# Patient Record
Sex: Female | Born: 1970
Health system: Southern US, Community
[De-identification: ages and names within clinical notes are randomized; demographics above are authoritative.]

## PROBLEM LIST (undated history)

## (undated) DIAGNOSIS — E559 Vitamin D deficiency, unspecified: Secondary | ICD-10-CM

## (undated) DIAGNOSIS — E538 Deficiency of other specified B group vitamins: Secondary | ICD-10-CM

## (undated) DIAGNOSIS — I10 Essential (primary) hypertension: Secondary | ICD-10-CM

## (undated) DIAGNOSIS — E785 Hyperlipidemia, unspecified: Secondary | ICD-10-CM

## (undated) DIAGNOSIS — K829 Disease of gallbladder, unspecified: Secondary | ICD-10-CM

## (undated) HISTORY — DX: Vitamin D deficiency, unspecified: E55.9

## (undated) HISTORY — PX: CHOLECYSTECTOMY: SHX55

## (undated) HISTORY — DX: Deficiency of other specified B group vitamins: E53.8

## (undated) HISTORY — DX: Disease of gallbladder, unspecified: K82.9

## (undated) HISTORY — DX: Hyperlipidemia, unspecified: E78.5

## (undated) HISTORY — DX: Essential (primary) hypertension: I10

## (undated) HISTORY — PX: WISDOM TOOTH EXTRACTION: SHX21

---

## 1999-04-05 ENCOUNTER — Encounter (INDEPENDENT_AMBULATORY_CARE_PROVIDER_SITE_OTHER): Payer: Self-pay | Admitting: Specialist

## 1999-04-05 ENCOUNTER — Encounter: Payer: Self-pay | Admitting: General Surgery

## 1999-04-05 ENCOUNTER — Observation Stay (HOSPITAL_COMMUNITY): Admission: RE | Admit: 1999-04-05 | Discharge: 1999-04-06 | Payer: Self-pay | Admitting: General Surgery

## 2000-02-21 ENCOUNTER — Other Ambulatory Visit: Admission: RE | Admit: 2000-02-21 | Discharge: 2000-02-21 | Payer: Self-pay | Admitting: Obstetrics and Gynecology

## 2001-10-29 ENCOUNTER — Other Ambulatory Visit: Admission: RE | Admit: 2001-10-29 | Discharge: 2001-10-29 | Payer: Self-pay | Admitting: Obstetrics and Gynecology

## 2002-10-28 ENCOUNTER — Encounter (INDEPENDENT_AMBULATORY_CARE_PROVIDER_SITE_OTHER): Payer: Self-pay

## 2002-10-28 ENCOUNTER — Inpatient Hospital Stay (HOSPITAL_COMMUNITY): Admission: AD | Admit: 2002-10-28 | Discharge: 2002-10-31 | Payer: Self-pay | Admitting: Obstetrics and Gynecology

## 2002-11-26 ENCOUNTER — Other Ambulatory Visit: Admission: RE | Admit: 2002-11-26 | Discharge: 2002-11-26 | Payer: Self-pay | Admitting: Obstetrics and Gynecology

## 2005-11-12 ENCOUNTER — Ambulatory Visit: Payer: Self-pay | Admitting: Family Medicine

## 2006-05-14 ENCOUNTER — Ambulatory Visit: Payer: Self-pay | Admitting: Family Medicine

## 2006-07-31 ENCOUNTER — Ambulatory Visit: Payer: Self-pay | Admitting: Family Medicine

## 2008-03-17 ENCOUNTER — Ambulatory Visit: Payer: Self-pay | Admitting: Family Medicine

## 2010-02-21 ENCOUNTER — Telehealth: Payer: Self-pay | Admitting: Family Medicine

## 2010-04-22 ENCOUNTER — Encounter: Payer: Self-pay | Admitting: Family Medicine

## 2010-04-23 ENCOUNTER — Ambulatory Visit: Payer: Self-pay | Admitting: Family Medicine

## 2010-04-30 ENCOUNTER — Ambulatory Visit: Payer: Self-pay | Admitting: Family Medicine

## 2010-05-03 ENCOUNTER — Encounter: Payer: Self-pay | Admitting: Family Medicine

## 2010-08-26 LAB — CONVERTED CEMR LAB: Pap Smear: NORMAL

## 2010-08-30 NOTE — Letter (Signed)
Summary: Results Follow up Letter  [facility]  [address] [place], [place] [address]   Phone: [phone]  Fax: [phone]    05/03/2010 MRN: [id number]    [facility] [address] [place], [place]  [address]    Dear Ms. Inscoe,  The following are the results of your recent test(s):  Test         Result    Pap Smear:        Normal _X___  Not Normal _____ Comments: ______________________________________________________ Cholesterol: LDL(Bad cholesterol):         Your goal is less than:         HDL (Good cholesterol):       Your goal is more than: Comments:  ______________________________________________________ Mammogram:        Normal _____  Not Normal _____ Comments:  ___________________________________________________________________ Hemoccult:        Normal _____  Not normal _______ Comments:    _____________________________________________________________________ Other Tests:    We routinely do not discuss normal results over the telephone.  If you desire a copy of the results, or you have any questions about this information we can discuss them at your next office visit.   Sincerely,    Idamae Schuller Tower,MD  MT/ri

## 2010-08-30 NOTE — Progress Notes (Signed)
Summary: needs order for labs  Phone Note Call from Patient Call back at [phone]   Caller: Patient Call For: Dr. Milinda Antis Summary of Call: Pt is coming in for appt in october and she would like to have labs prior.  Please order. Initial call taken by: Lowella Petties CMA,  February 21, 2010 1:57 PM  Follow-up for Phone Call        please do lipids and wellness v70.0- thanks  Follow-up by: Judith Part MD,  February 21, 2010 2:10 PM

## 2010-08-30 NOTE — Assessment & Plan Note (Signed)
Summary: CPX/DLO   Vital Signs:  Patient profile:   40 year old female Height:      62.25 inches Weight:      278 pounds BMI:     50.62 Temp:     98.3 degrees F oral Pulse rate:   100 / minute Pulse rhythm:   regular BP sitting:   120 / 76  (left arm) Cuff size:   large  Vitals Entered By: Lewanda Rife LPN (April 30, 2010 9:47 AM) CC: CPX GYN  does pap smear but pt would like breast exam today.   History of Present Illness: here for health mt exam and to review chronic med problems   sees gyn for pap wants breast exam today  labs from [facility] all normal wellness prof good chol with trig 131 and HDL 50 and LDL 102 this is better than last chol report   is trying to do better with her diet   is trying to exercise- zumba and walking  wt is stable   bp is good   leep in the past  due for pap - last summer 2010  no lumps in breasts  no mammograms   gets her flu shots at work         Allergies (verified): No Known Drug Allergies  Past History:  Family History: Last updated: 04/30/2010 adopted - does not know family history  Social History: Last updated: 04/30/2010 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 Eyes:  Denies blurring and eye irritation. CV:  Denies chest pain or discomfort, lightheadness, and palpitations. Resp:  Denies cough, pleuritic, and wheezing. GI:  Denies abdominal pain, change in bowel habits, and indigestion. GU:  Denies abnormal vaginal  bleeding, discharge, dysuria, and urinary frequency. MS:  Denies joint pain, joint redness, and joint swelling. Derm:  Denies itching, lesion(s), poor wound healing, and rash. Neuro:  Denies numbness and tingling. Psych:  Denies anxiety and depression. Endo:  Denies cold intolerance, excessive thirst, excessive urination, and heat intolerance. Heme:  Denies abnormal bruising and bleeding.  Physical Exam  General:  morbidly obese and well appearing  Head:  normocephalic, atraumatic, and no abnormalities observed.   Eyes:  vision grossly intact, pupils equal, pupils round, and pupils reactive to light.  no conjunctival pallor, injection or icterus  Ears:  R ear normal and L ear normal.   Nose:  no nasal discharge.   Mouth:  pharynx pink and moist.   Neck:  supple with full rom and no masses or thyromegally, no JVD or carotid bruit  Breasts:  No mass, nodules, thickening, tenderness, bulging, retraction, inflamation, nipple discharge or skin changes noted.   Lungs:  Normal respiratory effort, chest expands symmetrically. Lungs are clear to auscultation, no crackles or wheezes. Heart:  Normal rate and regular rhythm. S1 and S2 normal without gallop, murmur, click, rub or other extra sounds. Abdomen:  Bowel sounds positive,abdomen soft and non-tender without masses, organomegaly  or hernias noted. no renal bruits  Genitalia:  Normal introitus for age, no external lesions, no vaginal discharge, mucosa pink and moist, no vaginal or cervical lesions, no vaginal atrophy, no friaility or hemorrhage, normal uterus size and position, no adnexal masses or tenderness cervix is very posterior and obesity limits exam somewhat  Msk:  No deformity or scoliosis noted of thoracic or lumbar spine.  no acute joint changes  Pulses:  R and L carotid,radial,femoral,dorsalis pedis and posterior tibial pulses are full and equal bilaterally Extremities:  No clubbing, cyanosis, edema, or deformity noted with normal full  range of motion of all joints.   Neurologic:  sensation intact to light touch, gait normal, and DTRs symmetrical and normal.   Skin:  Intact without suspicious lesions or rashes lentigos diffusely  Cervical Nodes:  No lymphadenopathy noted Axillary Nodes:  No palpable lymphadenopathy Inguinal Nodes:  No significant adenopathy Psych:  normal affect, talkative and pleasant    Impression & Recommendations:  Problem # 1:  HEALTH MAINTENANCE EXAM (ICD-V70.0) Assessment Comment Only reviewed health habits including diet, exercise and skin cancer prevention reviewed health maintenance list and family history labs reviewed with pt today-- cholesterol is good! disc imp of wt loss to general health   Problem # 2:  ROUTINE GYNECOLOGICAL EXAMINATION (ICD-V72.31) Assessment: Comment Only annual exam with pap  remote hx of leep  iud year 7 no problems (non hormone type)   Complete Medication List: 1)  Motrin Ib 200 Mg Tabs (Ibuprofen) .... Otc as directed.  Patient Instructions: 1)  cholesterol is good  2)  keep working on healthy diet and exercise  3)  think about trying weight watchers again  4)  keep up the great exercise 5)  get your flu shot at work   Current Allergies (reviewed today): No known allergies      Preventive Care Screening  Pap Smear:    Date:  01/26/2009    Results:  normal

## 2010-12-14 NOTE — Op Note (Signed)
NAME:  Ana Johnson, Ana Johnson                        ACCOUNT NO.:  [id number]   MEDICAL RECORD NO.:  [id number]                   PATIENT TYPE:  INP   LOCATION:  9131                                 FACILITY:  [facility]   PHYSICIAN:  Randye Lobo, M.D.                DATE OF BIRTH:  April 04, 1971   DATE OF PROCEDURE:  10/28/2002  DATE OF DISCHARGE:                                 OPERATIVE REPORT   PREOPERATIVE DIAGNOSES:  1. Intrauterine gestation at 59 + 2 weeks'.  2. Cephalopelvic disproportion.  3. Chorioamnionitis.  4. Positive group B strep status.   POSTOPERATIVE DIAGNOSES:  1. Intrauterine gestation at 4 + 2 weeks'.  2. Cephalopelvic disproportion.  3. Chorioamnionitis.  4. Group B strep, positive status.  5. Moderate meconium-stained amniotic fluid.   PROCEDURE:  Primary low segment transverse cesarean section.   SURGEON:  Randye Lobo, M.D.   ASSISTANT:  Miguel Aschoff, M.D.   ANESTHESIA:  Epidural.   IV FLUIDS:  500 mL Ringers' lactate.   ESTIMATED BLOOD LOSS:  800 mL.   URINE OUTPUT:  50 mL.   COMPLICATIONS:  None.   INDICATIONS FOR PROCEDURE:  The patient is a 40 year old gravida 1, para 0,  who presented at 34 + 2 weeks' gestation with contractions over the last  three days.  The patient had reported leakage of fluid per vagina since  10/25/2002.  The patient was noted to have a positive group B Streptococcus  status.  The patient was 1-2 cm dilated and 100% effaced with the vertex at  the -1 station at the time of admission.  The fetal heart rate tracing was  reactive with a baseline fetal heart rate of approximately 160 beats.  Penicillin was begun for the group B Streptococcus-positive status.  The  patient received Stadol intravenous for pain control.  The patient went on  to have augmentation of her labor with Pitocin.  The patient did receive an  epidural for anesthesia.  The patient did develop hypertension following the  epidural which was treated with  ephedrine for normalization of her blood  pressure.  NIAPC was placed to assist in the administration of Pitocin.  The  patient was able to achieve adequate contractions.  Intermittently, the  fetal heart rate tracing would develop late decelerations which would  spontaneously resolve and revert to a reactive fetal heart rate tracing.  A  significant amount of caput was appreciated as the fetal vertex when the  cervix was noted to be 6 cm dilated and the vertex presentation was  confirmed at this time.  The patient went on to achieve 9 cm of cervical  dilation.  There was a significant bony prominence which was located on the  right posterior pelvic side wall.  The pubic arch was noted to be tight and  the vertex was noted to be at the -2 station with a significant amount of  caput.  The patient was diagnosed with cephalopelvic disproportion at this  point and the contracted pelvis and a recommendation was made to proceed  with a primary low segment transverse cesarean section after the risks,  benefits, and alternatives were reviewed with her and her husband.  They  chose to proceed.   FINDINGS:  A viable female infant was delivered at 23.  The Apgars were 7  at one minute and 9 at five minutes.  Presentation was noted to be left  occiput posterior.  A large amount of fetal caput and molding were  appreciated.  The weight of the newborn was later noted to be 8 pounds and 9  ounces.  Moderate meconium was appreciated at the time of cesarean section  and DeLee suctioning of the nares and mouth were performed of the newborn.   Examination of the pelvis and pelvic organs documented a very prominent  sacral promontory and a contracted pelvis.  The fallopian tubes and ovaries  were noted to be normal.  There was a 1.5 cm subserosal posterior fundal  fibroid.  The cord pH was noted to be 7.30.   SPECIMENS:  The placenta was sent to Pathology.   PROCEDURE:  With an IV and epidural and Foley  catheter in place, the patient  was taken from her labor and delivery suite to the operating room.  Her  epidural was dosed and the patient was placed in the supine position on the  operating room table.  The abdomen was sterilely prepped and draped.  Surgical anesthesia was achieved and a Pfannenstiel incision was then  created sharply with a scalpel and carried down to the fascia using  monopolar cautery.  The fascia was then incised in the midline with a  scalpel and the incision was carried out bilaterally using the Mayo  scissors.  The rectus muscles were dissected off of the overlying fascia  both superiorly and inferiorly and the rectus muscles were then divided  sharply in the midline.  A hemostat was used to elevated the parietal  peritoneum which was entered sharply with the Metzenbaum scissors cranially  and caudally.   The lower uterine segment was exposed and a bladder flap was sharply  created.  A transverse lower uterine segment incision was then created  sharply with the scalpel and the incision was extended with the bandage  scissors and then bluntly.  A hand was inserted through the incision and the  vertex was elevated and delivered without difficulty.  The nares and mouth  were suctioned with the DeLee suction and the shoulders and the remainder of  the infant were next delivered.  The cord was doubly clamped and cut and the  newborn was carried over to the awaiting pediatricians in vigorous  condition.   A cord pH and cord blood were obtained.  The placenta was then manually  extracted and remaining membranes were removed from within the uterine  cavity.  A moistened lap pad was used to wipe the inside of the uterine  cavity as well.  The uterine incision was then closed with a single layer  closure of locked 1 chromic.  There was a small extension which was noted in the left inferior aspect of the incision which was closed with a running-  locked suture of 1  chromic as well.  This was out of the way of the uterine  arteries and the ureter on this side.   The peritoneal cavity was then suctioned of  any remaining blood and clots.  An examination of the bony pelvis was performed and the findings as noted  above.  The uterus was then returned to the peritoneal cavity as it had been  exteriorized for the closure.  The parietal peritoneum was next closed with  a running suture of 3-0 plain.  The rectus muscles were approximated with  two figure-of-eight suture of  1 chromic.  The fascia was closed with a  running suture of 0 Vicryl.  The subcutaneous tissue was irrigated and  suctioned and made hemostatic with monopolar cautery.  Interrupted sutures  of 3-0 plain were placed in the subcutaneous layer and staples were placed  to close the skin.  A sterile bandage was placed over the incision.   This concluded the patient's procedure.  There were no complications to the  procedure.  All sponge, instrument, and needle counts were correct.   Based on the patient's contracted, bony pelvis, a recommendation is made to  proceed with repeat cesarean section for future deliveries.                                               Randye Lobo, M.D.    BES/MEDQ  D:  10/28/2002  T:  10/29/2002  Job:  [id number]

## 2010-12-14 NOTE — Discharge Summary (Signed)
NAME:  Ana Johnson, Ana Johnson                        ACCOUNT NO.:  [id number]   MEDICAL RECORD NO.:  [id number]                   PATIENT TYPE:  INP   LOCATION:  9131                                 FACILITY:  [facility]   PHYSICIAN:  Michelle L. Vincente Poli, M.D.            DATE OF BIRTH:  1971-06-05   DATE OF ADMISSION:  10/28/2002  DATE OF DISCHARGE:  10/31/2002                                 DISCHARGE SUMMARY   FINAL DIAGNOSES:  1. Intrauterine pregnancy at 61 and two-sevenths weeks gestation.  2. Cephalopelvic disproportion.  3. Positive group B strep status.  4. Chorioamnionitis and postpartum endometritis.   PROCEDURE:  Primary low segment transverse cesarean section.   SURGEON:  Randye Lobo, M.D.   ASSISTANT:  Miguel Aschoff, M.D.   COMPLICATIONS:  None.   HISTORY OF PRESENT ILLNESS:  This 40 year old G1 P0 presented at 26 and two-  sevenths weeks gestation with contractions over the last three days.  She  did report leakage of fluid per the vagina for the last day or two but was  uncertain if it was urine or amniotic fluid.  Upon admission the patient's  cervix was about 1-2 cm dilated, 100% effaced.  The patient's antepartum  course had been complicated by a nonimmune rubella status.  The patient was  also adopted and we do not have a family history on her.  She did have a  positive group B strep culture and history of a LEEP.  Otherwise, her  antepartum course had been uncomplicated.   HOSPITAL COURSE:  At this point she was admitted with questionable prolonged  latent phase of labor.  She was started on penicillin for her positive group  B strep status.  The patient did receive Stadol IV for pain control.  She  went on to have augmentation of her labor with Pitocin and did receive an  epidural for anesthesia.  She did develop some hypotension with the epidural  which was treated with ephedrine.  IUPCs were placed.  The patient did  achieve adequate contraction pattern.  The fetal  heart rate tracing was  reactive but did start to develop some late decelerations which did resolve  spontaneously.  The patient did go on to develop a low-grade temperature and  Tylenol was started, probably secondary to prolonged rupture of membranes.  She did get to about 9 cm dilated and -2 station at which time the pubic  arch was noted to be tight and a significant amount of caput was noted.  At  this point the patient was diagnosed with cephalopelvic disproportion and  the decision was made to proceed with a primary low segment transverse  cesarean section after the risks and benefits were discussed with the  patient and her husband.  At this point the patient was taken to the  operating room on October 28, 2002 by Dr. Conley Simmonds where a primary low  transverse cesarean  section was performed with the delivery of an 8 pound  9 ounce female infant with Apgars 7 and 9.  There was a prominent sacral  promontory noted and the patient did have moderate meconium-stained fluid.  At this point Dr. Edward Jolly did recommend repeat cesarean sections for future  deliveries secondary to her contracted bony pelvis.   The patient's postoperative course was still complicated by some low-grade  temperatures.  She was still contained on her antibiotic for her  chorioamnionitis.  She was changed from penicillin to ampicillin and  gentamycin and on postoperative day #2, clindamycin was added for anaerobic  coverage.  By postoperative day #3 the patient had been afebrile for 24  hours, she was feeling well, and she was felt ready for discharge.  She did  receive rubella vaccine before discharge.   DISPOSITION:  1. She was sent home on a regular diet.  2. Told to decrease activity.  3. Told to continue prenatal vitamins.  4. Given Percocet one to two q.4-6h. as needed for pain.  5. Follow up in the office in four weeks.   LABORATORY DATA ON DISCHARGE:  The patient had a hemoglobin of 10.9, a white  blood  cell count of 18.5.     Leilani Able, P.A.-C.                Michelle L. Vincente Poli, M.D.    MB/MEDQ  D:  11/18/2002  T:  11/18/2002  Job:  [id number]

## 2012-12-23 ENCOUNTER — Telehealth: Payer: Self-pay | Admitting: Family Medicine

## 2012-12-23 NOTE — Telephone Encounter (Signed)
Noted. Thanks.

## 2012-12-23 NOTE — Telephone Encounter (Signed)
Patient Information:  Caller Name: Marti Sleigh  Phone: [phone]  Patient: Ana Johnson, Ana Johnson  Gender: Female  DOB: 1971-01-21  Age: 42 Years  PCP: [facility], [facility])  Pregnant: No  Office Follow Up:  Does the office need to follow up with this patient?: No  Instructions For The Office: N/A   Symptoms  Reason For Call & Symptoms: Katiana states she had onset of rash to neck and chest on 12/20/12 after eating.  Red , pinpoint raised rash. Itchy. States rash has spread to stomach  and thighs. No fever. Per widespread rash protocol has see today  or tomorrow disposition due to mild widespread rash. Declined appointment and states she prefers to go to [facility]. Care advice given.  Reviewed Health History In EMR: Yes  Reviewed Medications In EMR: Yes  Reviewed Allergies In EMR: Yes  Reviewed Surgeries / Procedures: Yes  Date of Onset of Symptoms: 12/20/2012  Treatments Tried: Benadryl, Zyrtec  Treatments Tried Worked: No OB / GYN:  LMP: 12/23/2012  Guideline(s) Used:  Rash or Redness - Widespread  Disposition Per Guideline:   See Today or Tomorrow in Office  Reason For Disposition Reached:   Mild widespread rash  Advice Given:  Reassurance:  There are many causes of widespread rashes and most of the time they are not serious. Common causes include viral illness (e.g., cold viruses) and allergic reactions (to a food, medicine, or environmental exposure).  For Itchy Rashes:  Wash the skin once with gentle non-perfumed soap to remove any irritants. Rinse the soap off thoroughly.  You may also take an oatmeal (Aveeno) bath or take an antihistamine medication by mouth to help reduce the itching.  Oral Antihistamine Medication for Itching:  Take an antihistamine like diphenhydramine (Benadryl) for widespread rashes that itch. The adult dosage of Benadryl is 25-50 mg by mouth 4 times daily.  An over-the-counter antihistamine that causes less sleepiness is loratadine  (e.g., Alavert or Claritin).  Call Back If:   You become worse  Expected Course:  Most viral rashes disappear within 48 hours.  Patient Refused Recommendation:  Patient Will Go To [facility]  prefers to go to [facility]

## 2014-11-02 ENCOUNTER — Telehealth: Payer: Self-pay | Admitting: Family Medicine

## 2014-11-02 NOTE — Telephone Encounter (Signed)
Opened in error

## 2015-03-13 ENCOUNTER — Ambulatory Visit: Payer: Self-pay | Admitting: Family Medicine

## 2015-03-16 ENCOUNTER — Ambulatory Visit (INDEPENDENT_AMBULATORY_CARE_PROVIDER_SITE_OTHER): Payer: Federal, State, Local not specified - PPO | Admitting: Family Medicine

## 2015-03-16 ENCOUNTER — Encounter: Payer: Self-pay | Admitting: Family Medicine

## 2015-03-16 ENCOUNTER — Encounter (INDEPENDENT_AMBULATORY_CARE_PROVIDER_SITE_OTHER): Payer: Self-pay

## 2015-03-16 VITALS — BP 128/72 | HR 90 | Temp 98.0°F | Ht 62.0 in | Wt 284.8 lb

## 2015-03-16 DIAGNOSIS — Z0000 Encounter for general adult medical examination without abnormal findings: Secondary | ICD-10-CM

## 2015-03-16 DIAGNOSIS — Z01419 Encounter for gynecological examination (general) (routine) without abnormal findings: Secondary | ICD-10-CM

## 2015-03-16 NOTE — Assessment & Plan Note (Signed)
Reviewed preventive care protocols, scheduled due services, and updated immunizations Discussed nutrition, exercise, diet, and healthy lifestyle.  Orders Placed This Encounter  Procedures  . CBC with Differential/Platelet  . Comprehensive metabolic panel  . Lipid panel  . TSH  . Vitamin B12  . Vitamin D, 25-hydroxy

## 2015-03-16 NOTE — Patient Instructions (Signed)
Great to see you. We will call you with your lab results and you can view them online.

## 2015-03-16 NOTE — Progress Notes (Signed)
Subjective:   Patient ID: Ana Johnson, female    DOB: 1970/08/24, 44 y.o.   MRN: [id number]  Ana Johnson is a pleasant 44 y.o. year old female who presents to clinic today with [place] and Annual Exam  on 03/16/2015  HPI:  G1P1 here to establish care and for CPX.  Has GYN- Dr. Harrington Challenger- had pap smear and mammogram 08/2014.  Doing well- has a 17 year old daughter, married and elderly parents live with her.  No current outpatient prescriptions on file prior to visit.   No current facility-administered medications on file prior to visit.    No Known Allergies  History reviewed. No pertinent past medical history.  Past Surgical History  Procedure Laterality Date  . Cholecystectomy    . Cesarean section    . Wisdom tooth extraction      Family History  Problem Relation Age of Onset  . Adopted: Yes    Social History   Social History  . Marital Status: Married    Spouse Name: N/A  . Number of Children: N/A  . Years of Education: N/A   Occupational History  . Not on file.   Social History Main Topics  . Smoking status: Never Smoker   . Smokeless tobacco: Never Used  . Alcohol Use: No  . Drug Use: No  . Sexual Activity: Yes   Other Topics Concern  . Not on file   Social History Narrative   The PMH, PSH, Social History, Family History, Medications, and allergies have been reviewed in [facility], and have been updated if relevant.  Review of Systems  Constitutional: Positive for fatigue. Negative for fever and unexpected weight change.  HENT: Negative.   Eyes: Negative.   Respiratory: Negative.   Cardiovascular: Negative.   Gastrointestinal: Negative.   Endocrine: Negative.   Genitourinary: Negative.   Musculoskeletal: Negative.   Skin: Negative.   Allergic/Immunologic: Negative.   Neurological: Negative.   Hematological: Negative.   Psychiatric/Behavioral: Negative.   All other systems reviewed and are negative.      Objective:    BP 128/72  mmHg  Pulse 90  Temp(Src) 98 F (36.7 C) (Oral)  Ht 5' 2"  (1.575 m)  Wt 284 lb 12 oz (129.162 kg)  BMI 52.07 kg/m2  SpO2 98%  LMP 03/12/2015   Physical Exam   General:  Well-developed,well-nourished,in no acute distress; alert,appropriate and cooperative throughout examination Head:  normocephalic and atraumatic.   Eyes:  vision grossly intact, pupils equal, pupils round, and pupils reactive to light.   Ears:  R ear normal and L ear normal.   Nose:  no external deformity.   Mouth:  good dentition.   Neck:  No deformities, masses, or tenderness noted. Lungs:  Normal respiratory effort, chest expands symmetrically. Lungs are clear to auscultation, no crackles or wheezes. Heart:  Normal rate and regular rhythm. S1 and S2 normal without gallop, murmur, click, rub or other extra sounds. Abdomen:  Bowel sounds positive,abdomen soft and non-tender without masses, organomegaly or hernias noted. Msk:  No deformity or scoliosis noted of thoracic or lumbar spine.   Extremities:  No clubbing, cyanosis, edema, or deformity noted with normal full range of motion of all joints.   Neurologic:  alert & oriented X3 and gait normal.   Skin:  Intact without suspicious lesions or rashes Cervical Nodes:  No lymphadenopathy noted Axillary Nodes:  No palpable lymphadenopathy Psych:  Cognition and judgment appear intact. Alert and cooperative with normal attention span and  concentration. No apparent delusions, illusions, hallucinations       Assessment & Plan:   Well woman exam No Follow-up on file.

## 2015-03-16 NOTE — Progress Notes (Signed)
Pre visit review using our clinic review tool, if applicable. No additional management support is needed unless otherwise documented below in the visit note. 

## 2015-03-18 LAB — LIPID PANEL
Chol/HDL Ratio: 3.7 ratio units (ref 0.0–4.4)
Cholesterol, Total: 202 mg/dL — ABNORMAL HIGH (ref 100–199)
HDL: 54 mg/dL (ref >39)
LDL Calculated: 112 mg/dL — ABNORMAL HIGH (ref 0–99)
Triglycerides: 178 mg/dL — ABNORMAL HIGH (ref 0–149)
VLDL Cholesterol Cal: 36 mg/dL (ref 5–40)

## 2015-03-18 LAB — COMPREHENSIVE METABOLIC PANEL
ALT: 16 IU/L (ref 0–32)
AST: 15 IU/L (ref 0–40)
Albumin/Globulin Ratio: 1.5 (ref 1.1–2.5)
Albumin: 4.4 g/dL (ref 3.5–5.5)
Alkaline Phosphatase: 79 IU/L (ref 39–117)
BUN/Creatinine Ratio: 16 (ref 9–23)
BUN: 11 mg/dL (ref 6–24)
Bilirubin Total: 0.4 mg/dL (ref 0.0–1.2)
CO2: 18 mmol/L (ref 18–29)
Calcium: 9.7 mg/dL (ref 8.7–10.2)
Chloride: 104 mmol/L (ref 97–108)
Creatinine, Ser: 0.67 mg/dL (ref 0.57–1.00)
GFR calc Af Amer: 125 mL/min/{1.73_m2} (ref >59)
GFR calc non Af Amer: 109 mL/min/{1.73_m2} (ref >59)
Globulin, Total: 3 g/dL (ref 1.5–4.5)
Glucose: 96 mg/dL (ref 65–99)
Potassium: 4.5 mmol/L (ref 3.5–5.2)
Sodium: 143 mmol/L (ref 134–144)
Total Protein: 7.4 g/dL (ref 6.0–8.5)

## 2015-03-18 LAB — CBC WITH DIFFERENTIAL/PLATELET
Basophils Absolute: 0.1 10*3/uL (ref 0.0–0.2)
Basos: 1 %
EOS (ABSOLUTE): 0.3 10*3/uL (ref 0.0–0.4)
Eos: 3 %
Hematocrit: 37.1 % (ref 34.0–46.6)
Hemoglobin: 12.6 g/dL (ref 11.1–15.9)
Immature Grans (Abs): 0 10*3/uL (ref 0.0–0.1)
Immature Granulocytes: 0 %
Lymphocytes Absolute: 2.5 10*3/uL (ref 0.7–3.1)
Lymphs: 30 %
MCH: 27.8 pg (ref 26.6–33.0)
MCHC: 34 g/dL (ref 31.5–35.7)
MCV: 82 fL (ref 79–97)
Monocytes Absolute: 0.5 10*3/uL (ref 0.1–0.9)
Monocytes: 6 %
Neutrophils Absolute: 5.1 10*3/uL (ref 1.4–7.0)
Neutrophils: 60 %
Platelets: 341 10*3/uL (ref 150–379)
RBC: 4.54 x10E6/uL (ref 3.77–5.28)
RDW: 15.2 % (ref 12.3–15.4)
WBC: 8.4 10*3/uL (ref 3.4–10.8)

## 2015-03-18 LAB — VITAMIN B12: VITAMIN B 12: 328 pg/mL (ref 211–946)

## 2015-03-18 LAB — TSH: TSH: 1.81 u[IU]/mL (ref 0.450–4.500)

## 2015-03-18 LAB — VITAMIN D 25 HYDROXY (VIT D DEFICIENCY, FRACTURES): VIT D 25 HYDROXY: 20.5 ng/mL — AB (ref 30.0–100.0)

## 2015-03-20 MED ORDER — VITAMIN D (ERGOCALCIFEROL) 1.25 MG (50000 UNIT) PO CAPS: 50000.0000 [IU] | ORAL_CAPSULE | ORAL | Status: DC

## 2015-03-20 NOTE — Addendum Note (Signed)
Addended by: Modena Nunnery on: 03/20/2015 12:58 PM   Modules accepted: Orders

## 2015-05-12 ENCOUNTER — Other Ambulatory Visit: Payer: Self-pay | Admitting: Family Medicine

## 2015-05-12 DIAGNOSIS — E559 Vitamin D deficiency, unspecified: Secondary | ICD-10-CM

## 2015-05-17 ENCOUNTER — Other Ambulatory Visit (INDEPENDENT_AMBULATORY_CARE_PROVIDER_SITE_OTHER): Payer: Federal, State, Local not specified - PPO

## 2015-05-17 DIAGNOSIS — E559 Vitamin D deficiency, unspecified: Secondary | ICD-10-CM

## 2015-05-18 LAB — VITAMIN D 25 HYDROXY (VIT D DEFICIENCY, FRACTURES): Vit D, 25-Hydroxy: 22.7 ng/mL — ABNORMAL LOW (ref 30.0–100.0)

## 2015-09-25 ENCOUNTER — Encounter: Payer: Self-pay | Admitting: Family Medicine

## 2015-09-25 ENCOUNTER — Ambulatory Visit (INDEPENDENT_AMBULATORY_CARE_PROVIDER_SITE_OTHER): Payer: Federal, State, Local not specified - PPO | Admitting: Family Medicine

## 2015-09-25 VITALS — BP 108/70 | HR 87 | Temp 98.1°F | Wt 292.5 lb

## 2015-09-25 DIAGNOSIS — J069 Acute upper respiratory infection, unspecified: Secondary | ICD-10-CM | POA: Diagnosis not present

## 2015-09-25 MED ORDER — AMOXICILLIN-POT CLAVULANATE 875-125 MG PO TABS: 1.0000 | ORAL_TABLET | Freq: Two times a day (BID) | ORAL | Status: AC

## 2015-09-25 NOTE — Patient Instructions (Signed)
Take antibiotic as directed- Augmentin twice daily x 10 days.  Drink lots of fluids.    Treat sympotmatically with Mucinex, nasal saline irrigation, and Tylenol/Ibuprofen.   Also try an antihistamine/decongestant like claritin D or zyrtec D over the counter- two times a day as needed ( have to sign for them at pharmacy).   Try over the counter nasocort-start with 2 sprays per nostril per day...and then try to taper to 1 spray per nostril once symptoms improve.   You can use warm compresses.     Call if not improving as expected in 5-7 days.

## 2015-09-25 NOTE — Progress Notes (Signed)
Pre visit review using our clinic review tool, if applicable. No additional management support is needed unless otherwise documented below in the visit note. 

## 2015-09-25 NOTE — Progress Notes (Signed)
SUBJECTIVE:  Ana Johnson is a 45 y.o. female who complains of coryza, congestion, sore throat, swollen glands, post nasal drip and bilateral sinus pain for 8 days. She denies a history of anorexia, chest pain, chills and fevers and denies a history of asthma. Patient denies smoke cigarettes.   No current outpatient prescriptions on file prior to visit.   No current facility-administered medications on file prior to visit.    No Known Allergies  No past medical history on file.  Past Surgical History  Procedure Laterality Date  . Cholecystectomy    . Cesarean section    . Wisdom tooth extraction      Family History  Problem Relation Age of Onset  . Adopted: Yes    Social History   Social History  . Marital Status: Married    Spouse Name: N/A  . Number of Children: N/A  . Years of Education: N/A   Occupational History  . Not on file.   Social History Main Topics  . Smoking status: Never Smoker   . Smokeless tobacco: Never Used  . Alcohol Use: No  . Drug Use: No  . Sexual Activity: Yes   Other Topics Concern  . 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   The PMH, PSH, Social History, Family History, Medications, and allergies have been reviewed in [facility], and have been updated if relevant.  OBJECTIVE:  BP 108/70 mmHg  Pulse 87  Temp(Src) 98.1 F (36.7 C) (Oral)  Wt 292 lb 8 oz (132.677 kg)  SpO2 98%  LMP 09/08/2015  She appears well, vital signs are as noted. Ears normal.  Throat and pharynx normal.  Neck supple. No adenopathy in the neck. Nose is congested. Sinuses tender. The chest is clear, without wheezes or rales.  ASSESSMENT:  sinusitis  PLAN: Given duration and progression of symptoms, will treat for bacterial sinusitis with Augmentin. Symptomatic therapy suggested: push fluids, rest and return office visit prn if symptoms persist  or worsen.  Call or return to clinic prn if these symptoms worsen or fail to improve as anticipated.

## 2016-04-30 ENCOUNTER — Encounter: Payer: Self-pay | Admitting: Family Medicine

## 2016-04-30 ENCOUNTER — Ambulatory Visit (INDEPENDENT_AMBULATORY_CARE_PROVIDER_SITE_OTHER): Payer: Federal, State, Local not specified - PPO | Admitting: Family Medicine

## 2016-04-30 VITALS — BP 128/78 | HR 83 | Temp 98.0°F | Ht 62.0 in | Wt 300.5 lb

## 2016-04-30 DIAGNOSIS — Z23 Encounter for immunization: Secondary | ICD-10-CM | POA: Diagnosis not present

## 2016-04-30 DIAGNOSIS — Z01419 Encounter for gynecological examination (general) (routine) without abnormal findings: Secondary | ICD-10-CM | POA: Diagnosis not present

## 2016-04-30 NOTE — Progress Notes (Signed)
Pre visit review using our clinic review tool, if applicable. No additional management support is needed unless otherwise documented below in the visit note. 

## 2016-04-30 NOTE — Addendum Note (Signed)
Addended by: Modena Nunnery on: 04/30/2016 10:21 AM   Modules accepted: Orders

## 2016-04-30 NOTE — Patient Instructions (Addendum)
Great to see you. We will call you with your lab results.  I'm so sorry for your loss.

## 2016-04-30 NOTE — Addendum Note (Signed)
Addended by: Ellamae Sia on: 04/30/2016 11:15 AM   Modules accepted: Orders

## 2016-04-30 NOTE — Progress Notes (Signed)
Subjective:   Patient ID: Ana Johnson, female    DOB: October 02, 1970, 45 y.o.   MRN: [id number]  Ana Johnson is a pleasant 45 y.o. year old female who presents to clinic today with Annual Exam  on 04/30/2016  HPI:  G1P1 here for CPX.    Has GYN- Dr. Harrington Challenger- had pap smear and mammogram 02/2016.  Doing well- has a 53 year old daughter, married and elderly father.  Her mother died last year.  She feels she is coping well.   Lab Results  Component Value Date   CHOL 202 (H) 03/16/2015   HDL 54 03/16/2015   LDLCALC 112 (H) 03/16/2015   TRIG 178 (H) 03/16/2015   CHOLHDL 3.7 03/16/2015   Lab Results  Component Value Date   NA 143 03/16/2015   K 4.5 03/16/2015   CL 104 03/16/2015   CO2 18 03/16/2015   Lab Results  Component Value Date   TSH 1.810 03/16/2015   Lab Results  Component Value Date   WBC 8.4 03/16/2015   HCT 37.1 03/16/2015   MCV 82 03/16/2015   PLT 341 03/16/2015     No current outpatient prescriptions on file prior to visit.   No current facility-administered medications on file prior to visit.     No Known Allergies  No past medical history on file.  Past Surgical History:  Procedure Laterality Date  . CESAREAN SECTION    . CHOLECYSTECTOMY    . WISDOM TOOTH EXTRACTION      Family History  Problem Relation Age of Onset  . Adopted: Yes    Social History   Social History  . Marital status: Married    Spouse name: N/A  . Number of children: N/A  . Years of education: N/A   Occupational History  . Not on file.   Social History Main Topics  . Smoking status: Never Smoker  . Smokeless tobacco: Never Used  . Alcohol use No  . Drug use: No  . Sexual activity: Yes   Other Topics Concern  . 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   The PMH, PSH, Social History, Family History, Medications, and allergies have been  reviewed in [facility], and have been updated if relevant.  Review of Systems  Constitutional: Positive for fatigue. Negative for fever and unexpected weight change.  HENT: Negative.   Eyes: Negative.   Respiratory: Negative.   Cardiovascular: Negative.   Gastrointestinal: Negative.   Endocrine: Negative.   Genitourinary: Negative.   Musculoskeletal: Negative.   Skin: Negative.   Allergic/Immunologic: Negative.   Neurological: Negative.   Hematological: Negative.   Psychiatric/Behavioral: Negative.   All other systems reviewed and are negative.      Objective:    BP 128/78   Pulse 83   Temp 98 F (36.7 C) (Oral)   Ht 5' 2"  (1.575 m)   Wt (!) 300 lb 8 oz (136.3 kg)   LMP 04/28/2016   SpO2 98%   BMI 54.96 kg/m    Physical Exam   General:  Well-developed,well-nourished,in no acute distress; alert,appropriate and cooperative throughout examination Head:  normocephalic and atraumatic.   Eyes:  vision grossly intact, pupils equal, pupils round, and pupils reactive to light.   Ears:  R ear normal and L ear normal.   Nose:  no external deformity.  Mouth:  good dentition.   Neck:  No deformities, masses, or tenderness noted. Lungs:  Normal respiratory effort, chest expands symmetrically. Lungs are clear to auscultation, no crackles or wheezes. Heart:  Normal rate and regular rhythm. S1 and S2 normal without gallop, murmur, click, rub or other extra sounds. Abdomen:  Bowel sounds positive,abdomen soft and non-tender without masses, organomegaly or hernias noted. Msk:  No deformity or scoliosis noted of thoracic or lumbar spine.   Extremities:  No clubbing, cyanosis, edema, or deformity noted with normal full range of motion of all joints.   Neurologic:  alert & oriented X3 and gait normal.   Skin:  Intact without suspicious lesions or rashes Cervical Nodes:  No lymphadenopathy noted Axillary Nodes:  No palpable lymphadenopathy Psych:  Cognition and judgment appear intact. Alert  and cooperative with normal attention span and concentration. No apparent delusions, illusions, hallucinations       Assessment & Plan:   Well woman exam - Plan: CBC with Differential/Platelet, Comprehensive metabolic panel, Lipid panel, TSH No Follow-up on file.

## 2016-05-01 LAB — CBC WITH DIFFERENTIAL/PLATELET
BASOS ABS: 0 10*3/uL (ref 0.0–0.2)
Basos: 0 %
EOS (ABSOLUTE): 0.4 10*3/uL (ref 0.0–0.4)
Eos: 4 %
HEMOGLOBIN: 12.5 g/dL (ref 11.1–15.9)
Hematocrit: 35.7 % (ref 34.0–46.6)
IMMATURE GRANS (ABS): 0 10*3/uL (ref 0.0–0.1)
Immature Granulocytes: 0 %
LYMPHS: 29 %
Lymphocytes Absolute: 2.5 10*3/uL (ref 0.7–3.1)
MCH: 28 pg (ref 26.6–33.0)
MCHC: 35 g/dL (ref 31.5–35.7)
MCV: 80 fL (ref 79–97)
MONOCYTES: 5 %
Monocytes Absolute: 0.5 10*3/uL (ref 0.1–0.9)
NEUTROS PCT: 62 %
Neutrophils Absolute: 5.2 10*3/uL (ref 1.4–7.0)
PLATELETS: 340 10*3/uL (ref 150–379)
RBC: 4.47 x10E6/uL (ref 3.77–5.28)
RDW: 14.8 % (ref 12.3–15.4)
WBC: 8.6 10*3/uL (ref 3.4–10.8)

## 2016-05-01 LAB — LIPID PANEL
CHOL/HDL RATIO: 3.8 ratio (ref 0.0–4.4)
Cholesterol, Total: 209 mg/dL — ABNORMAL HIGH (ref 100–199)
HDL: 55 mg/dL (ref >39)
LDL CALC: 129 mg/dL — AB (ref 0–99)
Triglycerides: 124 mg/dL (ref 0–149)
VLDL CHOLESTEROL CAL: 25 mg/dL (ref 5–40)

## 2016-05-01 LAB — COMPREHENSIVE METABOLIC PANEL
ALBUMIN: 4.3 g/dL (ref 3.5–5.5)
ALK PHOS: 81 IU/L (ref 39–117)
ALT: 11 IU/L (ref 0–32)
AST: 13 IU/L (ref 0–40)
Albumin/Globulin Ratio: 1.3 (ref 1.2–2.2)
BUN/Creatinine Ratio: 14 (ref 9–23)
BUN: 10 mg/dL (ref 6–24)
Bilirubin Total: 0.4 mg/dL (ref 0.0–1.2)
CHLORIDE: 105 mmol/L (ref 96–106)
CO2: 18 mmol/L (ref 18–29)
CREATININE: 0.74 mg/dL (ref 0.57–1.00)
Calcium: 9.4 mg/dL (ref 8.7–10.2)
GFR calc Af Amer: 115 mL/min/{1.73_m2} (ref >59)
GFR calc non Af Amer: 100 mL/min/{1.73_m2} (ref >59)
GLUCOSE: 102 mg/dL — AB (ref 65–99)
Globulin, Total: 3.3 g/dL (ref 1.5–4.5)
Potassium: 4.3 mmol/L (ref 3.5–5.2)
Sodium: 144 mmol/L (ref 134–144)
Total Protein: 7.6 g/dL (ref 6.0–8.5)

## 2016-05-01 LAB — TSH: TSH: 1.57 u[IU]/mL (ref 0.450–4.500)

## 2016-06-01 ENCOUNTER — Encounter (HOSPITAL_BASED_OUTPATIENT_CLINIC_OR_DEPARTMENT_OTHER): Payer: Self-pay | Admitting: *Deleted

## 2016-06-01 ENCOUNTER — Emergency Department (HOSPITAL_BASED_OUTPATIENT_CLINIC_OR_DEPARTMENT_OTHER)
Admission: EM | Admit: 2016-06-01 | Discharge: 2016-06-01 | Disposition: A | Discharge status: Home / Self Care | Payer: Federal, State, Local not specified - PPO | Attending: Emergency Medicine | Admitting: Emergency Medicine

## 2016-06-01 DIAGNOSIS — R42 Dizziness and giddiness: Secondary | ICD-10-CM | POA: Insufficient documentation

## 2016-06-01 LAB — CBC WITH DIFFERENTIAL/PLATELET
BASOS ABS: 0 10*3/uL (ref 0.0–0.1)
BASOS PCT: 0 %
Eosinophils Absolute: 0.3 10*3/uL (ref 0.0–0.7)
Eosinophils Relative: 3 %
HEMATOCRIT: 37.1 % (ref 36.0–46.0)
HEMOGLOBIN: 13.2 g/dL (ref 12.0–15.0)
LYMPHS PCT: 27 %
Lymphs Abs: 2.6 10*3/uL (ref 0.7–4.0)
MCH: 28 pg (ref 26.0–34.0)
MCHC: 35.6 g/dL (ref 30.0–36.0)
MCV: 78.8 fL (ref 78.0–100.0)
Monocytes Absolute: 0.5 10*3/uL (ref 0.1–1.0)
Monocytes Relative: 6 %
NEUTROS ABS: 6.2 10*3/uL (ref 1.7–7.7)
NEUTROS PCT: 64 %
Platelets: 337 10*3/uL (ref 150–400)
RBC: 4.71 MIL/uL (ref 3.87–5.11)
RDW: 14.3 % (ref 11.5–15.5)
WBC: 9.6 10*3/uL (ref 4.0–10.5)

## 2016-06-01 LAB — COMPREHENSIVE METABOLIC PANEL
ALK PHOS: 70 U/L (ref 38–126)
ALT: 15 U/L (ref 14–54)
ANION GAP: 7 (ref 5–15)
AST: 15 U/L (ref 15–41)
Albumin: 4.1 g/dL (ref 3.5–5.0)
BILIRUBIN TOTAL: 0.5 mg/dL (ref 0.3–1.2)
BUN: 12 mg/dL (ref 6–20)
CALCIUM: 9.7 mg/dL (ref 8.9–10.3)
CO2: 25 mmol/L (ref 22–32)
Chloride: 107 mmol/L (ref 101–111)
Creatinine, Ser: 0.78 mg/dL (ref 0.44–1.00)
GFR calc non Af Amer: >60 mL/min (ref >60)
Glucose, Bld: 109 mg/dL — ABNORMAL HIGH (ref 65–99)
POTASSIUM: 4.2 mmol/L (ref 3.5–5.1)
SODIUM: 139 mmol/L (ref 135–145)
TOTAL PROTEIN: 7.9 g/dL (ref 6.5–8.1)

## 2016-06-01 LAB — URINALYSIS, ROUTINE W REFLEX MICROSCOPIC
BILIRUBIN URINE: NEGATIVE
Glucose, UA: NEGATIVE mg/dL
Hgb urine dipstick: NEGATIVE
Ketones, ur: NEGATIVE mg/dL
LEUKOCYTES UA: NEGATIVE
NITRITE: NEGATIVE
PH: 6 (ref 5.0–8.0)
Protein, ur: NEGATIVE mg/dL
SPECIFIC GRAVITY, URINE: 1.014 (ref 1.005–1.030)

## 2016-06-01 LAB — PREGNANCY, URINE: Preg Test, Ur: NEGATIVE

## 2016-06-01 MED ORDER — SODIUM CHLORIDE 0.9 % IV BOLUS (SEPSIS)
1000.0000 mL | Freq: Once | INTRAVENOUS | Status: AC
  Administered 2016-06-01: 1000 mL via INTRAVENOUS

## 2016-06-01 NOTE — ED Triage Notes (Signed)
Pt reports intermittent dizziness x1wk. Also reports intermittent nausea and diarrhea. States nothing seems to make dizziness better or worse. Denies LOC, vision changes, fever, vomiting, sob, chest pain, abd pain.

## 2016-06-01 NOTE — Discharge Instructions (Signed)
Follow up with your doctor as planned, drink plenty of fluids, return as needed for worsening symptoms

## 2016-06-01 NOTE — ED Provider Notes (Signed)
[facility] DEPT [facility] Provider Note   CSN: [id number] Arrival date & time: 06/01/16  0708     History   Chief Complaint Chief Complaint  Patient presents with  . Dizziness    HPI Ana Johnson is a 45 y.o. female.  HPI The patient presents to the emergency room with complaints of dizziness for the last week. Initially the symptoms started after what the patient thought was a GI bug. She had nausea and several bouts of diarrhea. The diarrhea has resolved with the last week but she continues to have nausea. She also has a sensation of dizziness. Patient states she does not feel like the room is moving or she is moving. She does not feel like she is given a pass out. She just feels intermittently lightheaded and no she doesn't feel quite right.  Pt states she feels "blah" and cant quite describe it otherwise.  Her symptoms have persisted so she came to the ED to get checked out.  No fevers.  No cp. No headache or abdominal pain.  No vomiting.  No numbness or weakness.  No dyspnea.  Her mouth feels dry.  History reviewed. No pertinent past medical history.   Past Surgical History:  Procedure Laterality Date  . CESAREAN SECTION    . CHOLECYSTECTOMY    . WISDOM TOOTH EXTRACTION       Home Medications    Prior to Admission medications   Not on File    Family History Family History  Problem Relation Age of Onset  . Adopted: Yes    Social History Social History  Substance Use Topics  . Smoking status: Never Smoker  . Smokeless tobacco: Never Used  . Alcohol use No     Allergies   Review of patient's allergies indicates no known allergies.   Review of Systems Review of Systems  All other systems reviewed and are negative.    Physical Exam Updated Vital Signs BP 154/92 (BP Location: Right Arm)   Pulse 95   Temp 98.4 F (36.9 C) (Oral)   Resp 18   Ht 5' 2"  (1.575 m)   Wt 131.1 kg   LMP 05/24/2016   SpO2 98%   BMI 52.86 kg/m   Physical Exam    Constitutional: She appears well-developed and well-nourished. No distress.  HENT:  Head: Normocephalic and atraumatic.  Right Ear: External ear normal.  Left Ear: External ear normal.  Eyes: Conjunctivae are normal. Right eye exhibits no discharge. Left eye exhibits no discharge. No scleral icterus.  Neck: Neck supple. No tracheal deviation present.  Cardiovascular: Normal rate, regular rhythm and intact distal pulses.   Pulmonary/Chest: Effort normal and breath sounds normal. No stridor. No respiratory distress. She has no wheezes. She has no rales.  Abdominal: Soft. Bowel sounds are normal. She exhibits no distension. There is no tenderness. There is no rebound and no guarding.  Musculoskeletal: She exhibits no edema or tenderness.  Neurological: She is alert. She has normal strength. No cranial nerve deficit (no facial droop, extraocular movements intact, no slurred speech) or sensory deficit. She exhibits normal muscle tone. She displays no seizure activity. Coordination normal.  Skin: Skin is warm and dry. No rash noted.  Psychiatric: She has a normal mood and affect.  Nursing note and vitals reviewed.    ED Treatments / Results  Labs (all labs ordered are listed, but only abnormal results are displayed) Labs Reviewed  COMPREHENSIVE METABOLIC PANEL - Abnormal; Notable for the following:  Result Value   Glucose, Bld 109 (*)    All other components within normal limits  CBC WITH DIFFERENTIAL/PLATELET  PREGNANCY, URINE  URINALYSIS, ROUTINE W REFLEX MICROSCOPIC (NOT AT [facility])    Procedures Procedures (including critical care time)  Medications Ordered in ED Medications  sodium chloride 0.9 % bolus 1,000 mL (1,000 mLs Intravenous New Bag/Given 06/01/16 0751)     Initial Impression / Assessment and Plan / ED Course  I have reviewed the triage vital signs and the nursing notes.  Pertinent labs & imaging results that were available during my care of the patient were  reviewed by me and considered in my medical decision making (see chart for details).  Clinical Course   Patient presented to the emergency room with complaints of vague lightheadedness and malaise. She denied any room spinning sensation. She denied any neurologic deficits or ataxia.  Physical exam including neurologic exam is normal.  Symptoms started after a viral illness.  It's possible she has residual symptoms associated with that recent illness.  Laboratory tests are reassuring today. No evidence of anemia severe dehydration hyperglycemia or other acute abnormalities.  Patient's blood pressure is mildly elevated but she does not have a history of hypertension.  Patient has plans to follow up with her primary care doctor next week. I encouraged fluids. Monitor for fever or worsening symptoms. Otherwise I believe she is safe to follow up with her primary care doctor.  Final Clinical Impressions(s) / ED Diagnoses   Final diagnoses:  Lightheadedness    New Prescriptions New Prescriptions   No medications on file     Dorie Rank, MD 06/01/16 [phone]

## 2016-06-03 ENCOUNTER — Ambulatory Visit (INDEPENDENT_AMBULATORY_CARE_PROVIDER_SITE_OTHER): Payer: Federal, State, Local not specified - PPO | Admitting: Family Medicine

## 2016-06-03 ENCOUNTER — Encounter: Payer: Self-pay | Admitting: Family Medicine

## 2016-06-03 DIAGNOSIS — R42 Dizziness and giddiness: Secondary | ICD-10-CM | POA: Diagnosis not present

## 2016-06-03 NOTE — Progress Notes (Signed)
   Subjective:   Patient ID: Ana Johnson, female    DOB: 1970/12/03, 45 y.o.   MRN: [id number]  Ana Johnson is a pleasant 46 y.o. year old female who presents to clinic today with Hospitalization Follow-up (dizziness)  on 06/03/2016  HPI:  Was seen at [facility] ED on 06/01/16. Note reviewed.  Complaint of dizziness, nausea and diarrhea. No abdominal pain or vomiting.  U preg neg UA neg  Labs (CMET, CBC) were unremarkable, thought she likely had a viral illness.  Given IVFs, felt better.  Advised to follow up with me.  Past day or two, still feels dizzy with changes in head position.  Some nausea, no vomiting.  No further diarrhea.  No current outpatient prescriptions on file prior to visit.   No current facility-administered medications on file prior to visit.     No Known Allergies  No past medical history on file.  Past Surgical History:  Procedure Laterality Date  . CESAREAN SECTION    . CHOLECYSTECTOMY    . WISDOM TOOTH EXTRACTION      Family History  Problem Relation Age of Onset  . Adopted: Yes    Social History   Social History  . Marital status: Married    Spouse name: N/A  . Number of children: N/A  . Years of education: N/A   Occupational History  . Not on file.   Social History Main Topics  . Smoking status: Never Smoker  . Smokeless tobacco: Never Used  . Alcohol use No  . Drug use: No  . Sexual activity: Yes    Birth control/ protection: IUD   Other Topics Concern  . 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   The PMH, PSH, Social History, Family History, Medications, and allergies have been reviewed in [facility], and have been updated if relevant.  Review of Systems  Constitutional: Negative.   Neurological: Positive for dizziness. Negative for tremors, seizures, syncope, facial asymmetry, speech difficulty, weakness,  light-headedness, numbness and headaches.  Hematological: Negative.   Psychiatric/Behavioral: Negative.   All other systems reviewed and are negative.      Objective:    BP 124/76   Pulse 88   Temp 98 F (36.7 C) (Oral)   Wt 295 lb 8 oz (134 kg)   LMP 05/24/2016   SpO2 98%   BMI 54.05 kg/m    Physical Exam  Constitutional: She is oriented to person, place, and time. She appears well-developed and well-nourished. No distress.  HENT:  Head: Normocephalic and atraumatic.  Right Ear: External ear normal.  Left Ear: External ear normal.  dix hallpike neg  Eyes: Conjunctivae are normal.  Cardiovascular: Normal rate and regular rhythm.   Pulmonary/Chest: Effort normal and breath sounds normal.  Musculoskeletal: Normal range of motion. She exhibits no edema.  Neurological: She is alert and oriented to person, place, and time. No cranial nerve deficit. Coordination normal.  Skin: Skin is warm and dry. She is not diaphoretic.  Psychiatric: She has a normal mood and affect. Her behavior is normal. Judgment and thought content normal.  Nursing note and vitals reviewed.         Assessment & Plan:   Dizziness and giddiness No Follow-up on file.

## 2016-06-03 NOTE — Assessment & Plan Note (Signed)
Improved but still having some mild symptoms with changes of head position. Neg dix hallpike in office today. ? Dehydration with some mild BPV s/p GI viral illness. Reassurance provided. Continue to push fluids. Call or return to clinic prn if these symptoms worsen or fail to improve as anticipated. The patient indicates understanding of these issues and agrees with the plan.

## 2016-06-03 NOTE — Progress Notes (Signed)
Pre visit review using our clinic review tool, if applicable. No additional management support is needed unless otherwise documented below in the visit note. 

## 2016-11-07 DIAGNOSIS — J00 Acute nasopharyngitis [common cold]: Secondary | ICD-10-CM | POA: Diagnosis not present

## 2016-11-07 DIAGNOSIS — J209 Acute bronchitis, unspecified: Secondary | ICD-10-CM | POA: Diagnosis not present

## 2017-05-13 ENCOUNTER — Ambulatory Visit (INDEPENDENT_AMBULATORY_CARE_PROVIDER_SITE_OTHER): Payer: Federal, State, Local not specified - PPO | Admitting: Family Medicine

## 2017-05-13 ENCOUNTER — Encounter: Payer: Self-pay | Admitting: Family Medicine

## 2017-05-13 VITALS — BP 132/84 | HR 95 | Temp 98.5°F | Ht 62.0 in | Wt 296.8 lb

## 2017-05-13 DIAGNOSIS — Z23 Encounter for immunization: Secondary | ICD-10-CM | POA: Diagnosis not present

## 2017-05-13 DIAGNOSIS — Z01419 Encounter for gynecological examination (general) (routine) without abnormal findings: Secondary | ICD-10-CM

## 2017-05-13 NOTE — Progress Notes (Signed)
Subjective:   Patient ID: Ana Johnson, female    DOB: Nov 18, 1970, 46 y.o.   MRN: [id number]  Ana Johnson is a pleasant 46 y.o. year old female who presents to clinic today with Annual Exam (Patient is here today for a CPE and is fasting.  Agrees to get Flu & Tdap today. When ordering labs she states to have them sent [facility] and she agrees to HIV screen.)  on 05/13/2017  HPI:   Has GYN- Dr. Harrington Challenger- had pap smear and mammogram scheduled this month.  Doing well- has a 44 year old daughter, married and elderly father lives with her.    No current outpatient prescriptions on file prior to visit.   No current facility-administered medications on file prior to visit.     No Known Allergies  No past medical history on file.  Past Surgical History:  Procedure Laterality Date  . CESAREAN SECTION    . CHOLECYSTECTOMY    . WISDOM TOOTH EXTRACTION      Family History  Problem Relation Age of Onset  . Adopted: Yes    Social History   Social History  . Marital status: Married    Spouse name: N/A  . Number of children: N/A  . Years of education: N/A   Occupational History  . Not on file.   Social History Main Topics  . Smoking status: Never Smoker  . Smokeless tobacco: Never Used  . Alcohol use No  . Drug use: No  . Sexual activity: Yes    Birth control/ protection: IUD   Other Topics Concern  . 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   The PMH, PSH, Social History, Family History, Medications, and allergies have been reviewed in [facility], and have been updated if relevant.  Review of Systems  Constitutional: Negative.  Negative for fatigue, fever and unexpected weight change.  HENT: Negative.   Eyes: Negative.   Respiratory: Negative.   Cardiovascular: Negative.   Gastrointestinal: Negative.   Endocrine: Negative.   Genitourinary: Negative.     Musculoskeletal: Negative.   Skin: Negative.   Allergic/Immunologic: Negative.   Neurological: Negative.   Hematological: Negative.   Psychiatric/Behavioral: Negative.   All other systems reviewed and are negative.      Objective:    BP 132/84 (BP Location: Left Arm, Patient Position: Sitting, Cuff Size: Large)   Pulse 95   Temp 98.5 F (36.9 C) (Oral)   Ht 5' 2"  (1.575 m)   Wt 296 lb 12.8 oz (134.6 kg)   LMP 04/28/2017   SpO2 97%   BMI 54.29 kg/m    Physical Exam    General:  Well-developed,well-nourished,in no acute distress; alert,appropriate and cooperative throughout examination Head:  normocephalic and atraumatic.   Eyes:  vision grossly intact, PERRL Ears:  R ear normal and L ear normal externally, TMs clear bilaterally Nose:  no external deformity.   Mouth:  good dentition.   Neck:  No deformities, masses, or tenderness noted. ?small cystic, freely movable lesion underneath left ear lobe- per pt has been there for many months unchanged Breasts:  No mass, nodules, thickening, tenderness, bulging, retraction, inflamation, nipple discharge or skin changes noted.   Lungs:  Normal respiratory effort, chest expands symmetrically. Lungs are clear to auscultation, no crackles or wheezes. Heart:  Normal rate and regular rhythm.  S1 and S2 normal without gallop, murmur, click, rub or other extra sounds. Abdomen:  Bowel sounds positive,abdomen soft and non-tender without masses, organomegaly or hernias noted. Msk:  No deformity or scoliosis noted of thoracic or lumbar spine.   Extremities:  No clubbing, cyanosis, edema, or deformity noted with normal full range of motion of all joints.   Neurologic:  alert & oriented X3 and gait normal.   Skin:  Intact without suspicious lesions or rashes Cervical Nodes:  No lymphadenopathy noted Axillary Nodes:  No palpable lymphadenopathy Psych:  Cognition and judgment appear intact. Alert and cooperative with normal attention span and  concentration. No apparent delusions, illusions, hallucinations        Assessment & Plan:   Well woman exam - Plan: CBC with Differential/Platelet, Comprehensive metabolic panel, Lipid panel, TSH, HIV antibody (with reflex) No Follow-up on file.

## 2017-05-13 NOTE — Patient Instructions (Signed)
Great to see you.  We will call you with your lab results or you can view them online.

## 2017-05-13 NOTE — Assessment & Plan Note (Signed)
Reviewed preventive care protocols, scheduled due services, and updated immunizations Discussed nutrition, exercise, diet, and healthy lifestyle.  Orders Placed This Encounter  Procedures  . CBC with Differential/Platelet  . Comprehensive metabolic panel  . Lipid panel  . TSH  . HIV antibody (with reflex)   Influenza and tetanus vaccines given today.

## 2017-05-14 LAB — LIPID PANEL
CHOLESTEROL TOTAL: 197 mg/dL (ref 100–199)
Chol/HDL Ratio: 3.6 ratio (ref 0.0–4.4)
HDL: 54 mg/dL (ref >39)
LDL Calculated: 111 mg/dL — ABNORMAL HIGH (ref 0–99)
Triglycerides: 161 mg/dL — ABNORMAL HIGH (ref 0–149)
VLDL CHOLESTEROL CAL: 32 mg/dL (ref 5–40)

## 2017-05-14 LAB — COMPREHENSIVE METABOLIC PANEL
ALK PHOS: 81 IU/L (ref 39–117)
ALT: 10 IU/L (ref 0–32)
AST: 13 IU/L (ref 0–40)
Albumin/Globulin Ratio: 1.4 (ref 1.2–2.2)
Albumin: 4.2 g/dL (ref 3.5–5.5)
BILIRUBIN TOTAL: 0.4 mg/dL (ref 0.0–1.2)
BUN/Creatinine Ratio: 13 (ref 9–23)
BUN: 10 mg/dL (ref 6–24)
CHLORIDE: 101 mmol/L (ref 96–106)
CO2: 22 mmol/L (ref 20–29)
Calcium: 9.7 mg/dL (ref 8.7–10.2)
Creatinine, Ser: 0.79 mg/dL (ref 0.57–1.00)
GFR calc Af Amer: 105 mL/min/{1.73_m2} (ref >59)
GFR calc non Af Amer: 91 mL/min/{1.73_m2} (ref >59)
Globulin, Total: 2.9 g/dL (ref 1.5–4.5)
Glucose: 102 mg/dL — ABNORMAL HIGH (ref 65–99)
POTASSIUM: 4.5 mmol/L (ref 3.5–5.2)
Sodium: 141 mmol/L (ref 134–144)
Total Protein: 7.1 g/dL (ref 6.0–8.5)

## 2017-05-14 LAB — CBC WITH DIFFERENTIAL/PLATELET
BASOS ABS: 0.1 10*3/uL (ref 0.0–0.2)
Basos: 1 %
EOS (ABSOLUTE): 0.4 10*3/uL (ref 0.0–0.4)
Eos: 4 %
Hematocrit: 36.6 % (ref 34.0–46.6)
Hemoglobin: 12.6 g/dL (ref 11.1–15.9)
Immature Grans (Abs): 0 10*3/uL (ref 0.0–0.1)
Immature Granulocytes: 0 %
LYMPHS: 25 %
Lymphocytes Absolute: 2.7 10*3/uL (ref 0.7–3.1)
MCH: 27.6 pg (ref 26.6–33.0)
MCHC: 34.4 g/dL (ref 31.5–35.7)
MCV: 80 fL (ref 79–97)
MONOCYTES: 6 %
Monocytes Absolute: 0.6 10*3/uL (ref 0.1–0.9)
NEUTROS PCT: 64 %
Neutrophils Absolute: 6.8 10*3/uL (ref 1.4–7.0)
PLATELETS: 338 10*3/uL (ref 150–379)
RBC: 4.57 x10E6/uL (ref 3.77–5.28)
RDW: 15.4 % (ref 12.3–15.4)
WBC: 10.5 10*3/uL (ref 3.4–10.8)

## 2017-05-14 LAB — HIV ANTIBODY (ROUTINE TESTING W REFLEX): HIV SCREEN 4TH GENERATION: NONREACTIVE

## 2017-05-14 LAB — TSH: TSH: 1.92 u[IU]/mL (ref 0.450–4.500)

## 2018-05-08 DIAGNOSIS — Z01419 Encounter for gynecological examination (general) (routine) without abnormal findings: Secondary | ICD-10-CM | POA: Diagnosis not present

## 2018-05-08 DIAGNOSIS — Z6841 Body Mass Index (BMI) 40.0 and over, adult: Secondary | ICD-10-CM | POA: Diagnosis not present

## 2018-05-08 DIAGNOSIS — Z1231 Encounter for screening mammogram for malignant neoplasm of breast: Secondary | ICD-10-CM | POA: Diagnosis not present

## 2018-05-18 ENCOUNTER — Encounter: Payer: Self-pay | Admitting: Family Medicine

## 2018-05-18 ENCOUNTER — Other Ambulatory Visit (INDEPENDENT_AMBULATORY_CARE_PROVIDER_SITE_OTHER): Payer: Federal, State, Local not specified - PPO

## 2018-05-18 ENCOUNTER — Ambulatory Visit (INDEPENDENT_AMBULATORY_CARE_PROVIDER_SITE_OTHER): Payer: Federal, State, Local not specified - PPO | Admitting: Family Medicine

## 2018-05-18 VITALS — BP 126/82 | HR 94 | Temp 99.3°F | Ht 61.81 in | Wt 298.0 lb

## 2018-05-18 DIAGNOSIS — R5383 Other fatigue: Secondary | ICD-10-CM

## 2018-05-18 DIAGNOSIS — E559 Vitamin D deficiency, unspecified: Secondary | ICD-10-CM | POA: Insufficient documentation

## 2018-05-18 DIAGNOSIS — Z0000 Encounter for general adult medical examination without abnormal findings: Secondary | ICD-10-CM

## 2018-05-18 DIAGNOSIS — Z6841 Body Mass Index (BMI) 40.0 and over, adult: Secondary | ICD-10-CM

## 2018-05-18 DIAGNOSIS — Z23 Encounter for immunization: Secondary | ICD-10-CM | POA: Diagnosis not present

## 2018-05-18 DIAGNOSIS — E785 Hyperlipidemia, unspecified: Secondary | ICD-10-CM | POA: Diagnosis not present

## 2018-05-18 DIAGNOSIS — Z0001 Encounter for general adult medical examination with abnormal findings: Secondary | ICD-10-CM | POA: Insufficient documentation

## 2018-05-18 NOTE — Assessment & Plan Note (Signed)
Refer to weight loss clinic.  Number and handout given.

## 2018-05-18 NOTE — Assessment & Plan Note (Signed)
Reviewed preventive care protocols, scheduled due services, and updated immunizations Discussed nutrition, exercise, diet, and healthy lifestyle.  Influenza vaccine given today.

## 2018-05-18 NOTE — Addendum Note (Signed)
Addended by: Lynnea Ferrier on: 05/18/2018 10:10 AM   Modules accepted: Orders

## 2018-05-18 NOTE — Progress Notes (Signed)
Subjective:   Patient ID: Ana Johnson, female    DOB: 03/10/1971, 47 y.o.   MRN: [id number]  Ana Johnson is a pleasant 47 y.o. year old female who presents to clinic today with Annual Exam (Patient is here today for a CPE without PAP.  She goes to [facility] for Paps (2017) and Mamms (10.11.19 WNL).  She is currently fasting.  Agrees to get her flu shot today.  )  on 05/18/2018  HPI:  Health Maintenance  Topic Date Due  . INFLUENZA VACCINE  02/26/2018  . PAP SMEAR  04/12/2019  . TETANUS/TDAP  05/14/2027  . HIV Screening  Completed   Has GYN- Dr. Carlis Abbott- had pap smear in 2017. Mammogram 05/08/18 Has IUD  Doing well- her teenage daughter has been doing well. Her dad died in 12-18-17 but she feels she is coping okay.  Does have a h/o Vitamin D deficiency.  Obesity- she has tried weight watchers in the past.  Wants to lose weight but feels overwhelmed about where to start. Wt Readings from Last 3 Encounters:  05/18/18 298 lb (135.2 kg)  05/13/17 296 lb 12.8 oz (134.6 kg)  06/03/16 295 lb 8 oz (134 kg)      Current Outpatient Medications on File Prior to Visit  Medication Sig Dispense Refill  . PARAGARD INTRAUTERINE COPPER IUD IUD ParaGard T 380A 380 square mm intrauterine device     No current facility-administered medications on file prior to visit.     No Known Allergies  No past medical history on file.  Past Surgical History:  Procedure Laterality Date  . CESAREAN SECTION    . CHOLECYSTECTOMY    . WISDOM TOOTH EXTRACTION      Family History  Adopted: Yes    Social History   Socioeconomic History  . Marital status: Married    Spouse name: Not on file  . Number of children: Not on file  . Years of education: Not on file  . Highest education level: Not on file  Occupational History  . Not on file  Social Needs  . Financial resource strain: Not on file  . Food insecurity:    Worry: Not on file    Inability: Not on file  .  Transportation needs:    Medical: Not on file    Non-medical: Not on file  Tobacco Use  . Smoking status: Never Smoker  . Smokeless tobacco: Never Used  Substance and Sexual Activity  . Alcohol use: No  . Drug use: No  . Sexual activity: Yes    Birth control/protection: IUD  Lifestyle  . Physical activity:    Days per week: Not on file    Minutes per session: Not on file  . Stress: Not on file  Relationships  . Social connections:    Talks on phone: Not on file    Gets together: Not on file    Attends religious service: Not on file    Active member of club or organization: Not on file    Attends meetings of clubs or organizations: Not on file    Relationship status: Not on file  . Intimate partner violence:    Fear of current or ex partner: Not on file    Emotionally abused: Not on file    Physically abused: Not on file    Forced sexual activity: Not on file  Other Topics Concern  . 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   The PMH, PSH, Social History, Family History, Medications, and allergies have been reviewed in [facility], and have been updated if relevant.   Review of Systems  Constitutional: Positive for fatigue.  HENT: Negative.   Eyes: Negative.   Respiratory: Negative.   Cardiovascular: Negative.   Gastrointestinal: Negative.   Endocrine: Negative.   Genitourinary: Negative.   Musculoskeletal: Negative.   Allergic/Immunologic: Negative.   Neurological: Negative.   Hematological: Negative.   Psychiatric/Behavioral: Negative.   All other systems reviewed and are negative.      Objective:    BP 126/82 (BP Location: Left Arm, Patient Position: Sitting, Cuff Size: Large)   Pulse 94   Temp 99.6 F (37.6 C) (Oral)   Ht 5' 1.81" (1.57 m)   Wt 298 lb (135.2 kg)   LMP 04/28/2018 (Exact Date)   SpO2 97%   BMI 54.84 kg/m    Physical  Exam   General:  Well-developed,well-nourished,in no acute distress; alert,appropriate and cooperative throughout examination Head:  normocephalic and atraumatic.   Eyes:  vision grossly intact, PERRL Ears:  R ear normal and L ear normal externally, TMs clear bilaterally Nose:  no external deformity.   Mouth:  good dentition.   Neck:  No deformities, masses, or tenderness noted. Lungs:  Normal respiratory effort, chest expands symmetrically. Lungs are clear to auscultation, no crackles or wheezes. Heart:  Normal rate and regular rhythm. S1 and S2 normal without gallop, murmur, click, rub or other extra sounds. Abdomen:  Bowel sounds positive,abdomen soft and non-tender without masses, organomegaly or hernias noted. Msk:  No deformity or scoliosis noted of thoracic or lumbar spine.   Extremities:  No clubbing, cyanosis, edema, or deformity noted with normal full range of motion of all joints.   Neurologic:  alert & oriented X3 and gait normal.   Skin:  Intact without suspicious lesions or rashes Cervical Nodes:  No lymphadenopathy noted Axillary Nodes:  No palpable lymphadenopathy Psych:  Cognition and judgment appear intact. Alert and cooperative with normal attention span and concentration. No apparent delusions, illusions, hallucinations       Assessment & Plan:   Well woman exam without gynecological exam  Need for influenza vaccination - Plan: Flu Vaccine QUAD 6+ mos PF IM (Fluarix Quad PF)  Vitamin D deficiency - Plan: Vitamin D (25 hydroxy)  Hyperlipidemia, unspecified hyperlipidemia type - Plan: TSH, CBC, Lipid panel, Comprehensive metabolic panel  BMI 01.6-01.0, adult ([place]) - Plan: Hemoglobin A1c No follow-ups on file.

## 2018-05-18 NOTE — Assessment & Plan Note (Signed)
Likely multifactorial. Check labs as part of initial work up, refer to weight loss clinic as this is most likely a contributing factor. See below.

## 2018-05-18 NOTE — Patient Instructions (Addendum)
Great to see you. I will call you with your lab results from today and you can view them online.   Call the weight loss clinic as we talked about today.  I am sorry about the loss of your father.

## 2018-05-18 NOTE — Assessment & Plan Note (Signed)
Recheck Vitamin D today.

## 2018-05-19 ENCOUNTER — Ambulatory Visit: Payer: Federal, State, Local not specified - PPO | Admitting: Family Medicine

## 2018-05-19 ENCOUNTER — Encounter: Payer: Self-pay | Admitting: Family Medicine

## 2018-05-19 ENCOUNTER — Ambulatory Visit (INDEPENDENT_AMBULATORY_CARE_PROVIDER_SITE_OTHER): Payer: Federal, State, Local not specified - PPO

## 2018-05-19 VITALS — BP 128/74 | HR 88

## 2018-05-19 DIAGNOSIS — M79644 Pain in right finger(s): Secondary | ICD-10-CM

## 2018-05-19 DIAGNOSIS — M25531 Pain in right wrist: Secondary | ICD-10-CM | POA: Diagnosis not present

## 2018-05-19 DIAGNOSIS — M19031 Primary osteoarthritis, right wrist: Secondary | ICD-10-CM | POA: Diagnosis not present

## 2018-05-19 LAB — COMPREHENSIVE METABOLIC PANEL
A/G RATIO: 1.4 (ref 1.2–2.2)
ALK PHOS: 77 IU/L (ref 39–117)
ALT: 13 IU/L (ref 0–32)
AST: 13 IU/L (ref 0–40)
Albumin: 4.2 g/dL (ref 3.5–5.5)
BUN/Creatinine Ratio: 10 (ref 9–23)
BUN: 8 mg/dL (ref 6–24)
Bilirubin Total: 0.4 mg/dL (ref 0.0–1.2)
CO2: 20 mmol/L (ref 20–29)
Calcium: 9.4 mg/dL (ref 8.7–10.2)
Chloride: 101 mmol/L (ref 96–106)
Creatinine, Ser: 0.83 mg/dL (ref 0.57–1.00)
GFR calc Af Amer: 98 mL/min/{1.73_m2} (ref >59)
GFR calc non Af Amer: 85 mL/min/{1.73_m2} (ref >59)
Globulin, Total: 2.9 g/dL (ref 1.5–4.5)
Glucose: 88 mg/dL (ref 65–99)
POTASSIUM: 4.4 mmol/L (ref 3.5–5.2)
SODIUM: 138 mmol/L (ref 134–144)
Total Protein: 7.1 g/dL (ref 6.0–8.5)

## 2018-05-19 LAB — LIPID PANEL
CHOL/HDL RATIO: 3.8 ratio (ref 0.0–4.4)
Cholesterol, Total: 194 mg/dL (ref 100–199)
HDL: 51 mg/dL (ref >39)
LDL Calculated: 115 mg/dL — ABNORMAL HIGH (ref 0–99)
Triglycerides: 141 mg/dL (ref 0–149)
VLDL CHOLESTEROL CAL: 28 mg/dL (ref 5–40)

## 2018-05-19 LAB — CBC
Hematocrit: 38.7 % (ref 34.0–46.6)
Hemoglobin: 13 g/dL (ref 11.1–15.9)
MCH: 28.3 pg (ref 26.6–33.0)
MCHC: 33.6 g/dL (ref 31.5–35.7)
MCV: 84 fL (ref 79–97)
PLATELETS: 333 10*3/uL (ref 150–450)
RBC: 4.59 x10E6/uL (ref 3.77–5.28)
RDW: 14.5 % (ref 12.3–15.4)
WBC: 10.3 10*3/uL (ref 3.4–10.8)

## 2018-05-19 LAB — VITAMIN D 25 HYDROXY (VIT D DEFICIENCY, FRACTURES): VIT D 25 HYDROXY: 17.4 ng/mL — AB (ref 30.0–100.0)

## 2018-05-19 LAB — VITAMIN B12: VITAMIN B 12: 266 pg/mL (ref 232–1245)

## 2018-05-19 LAB — HEMOGLOBIN A1C
ESTIMATED AVERAGE GLUCOSE: 108 mg/dL
HEMOGLOBIN A1C: 5.4 % (ref 4.8–5.6)

## 2018-05-19 LAB — TSH: TSH: 1.28 u[IU]/mL (ref 0.450–4.500)

## 2018-05-19 MED ORDER — NAPROXEN-ESOMEPRAZOLE 500-20 MG PO TBEC: 1.0000 | DELAYED_RELEASE_TABLET | Freq: Two times a day (BID) | ORAL | 3 refills | Status: DC

## 2018-05-19 NOTE — Progress Notes (Signed)
Ana Johnson - 47 y.o. female MRN [id number]  Date of birth: Jul 28, 1971  SUBJECTIVE:  Including CC & ROS.  Chief Complaint  Patient presents with  . Hand Pain    Ana Johnson is a 47 y.o. female that is presenting with right thumb pain. Ongoing for three months. Pain is located at her right thumb, located at the thenar eminence. Denies locking. Pain is constant during any movement. Admits to difficulty grasping jars. Weakness is present. She has been taking motrin for the pain. She uses a computer daily and types.She notice the pain after using her phone.  I, Verlene Mayer, CMA serving as a Education administrator for [employer]  Constitutional: Negative for fever.  HENT: Negative for congestion.   Respiratory: Negative for cough.   Cardiovascular: Negative for chest pain.  Gastrointestinal: Negative for abdominal pain.  Musculoskeletal: Negative for back pain.  Skin: Negative for color change.  Neurological: Negative for weakness.  Hematological: Negative for adenopathy.  Psychiatric/Behavioral: Negative for agitation.    HISTORY: Past Medical, Surgical, Social, and Family History Reviewed & Updated per EMR.   Pertinent Historical Findings include:  No past medical history on file.  Past Surgical History:  Procedure Laterality Date  . CESAREAN SECTION    . CHOLECYSTECTOMY    . WISDOM TOOTH EXTRACTION      No Known Allergies  Family History  Adopted: Yes     Social History   Socioeconomic History  . Marital status: Married    Spouse name: Not on file  . Number of children: Not on file  . Years of education: Not on file  . Highest education level: Not on file  Occupational History  . Not on file  Social Needs  . Financial resource strain: Not on file  . Food insecurity:    Worry: Not on file    Inability: Not on file  . Transportation needs:    Medical: Not on file    Non-medical: Not on file  Tobacco Use  . Smoking status: Never  Smoker  . Smokeless tobacco: Never Used  Substance and Sexual Activity  . Alcohol use: No  . Drug use: No  . Sexual activity: Yes    Birth control/protection: IUD  Lifestyle  . Physical activity:    Days per week: Not on file    Minutes per session: Not on file  . Stress: Not on file  Relationships  . Social connections:    Talks on phone: Not on file    Gets together: Not on file    Attends religious service: Not on file    Active member of club or organization: Not on file    Attends meetings of clubs or organizations: Not on file    Relationship status: Not on file  . Intimate partner violence:    Fear of current or ex partner: Not on file    Emotionally abused: Not on file    Physically abused: Not on file    Forced sexual activity: Not on file  Other Topics Concern  . 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     PHYSICAL EXAM:  VS: BP 128/74   Pulse 88   LMP 04/28/2018 (Exact Date)   SpO2 98%  Physical Exam Gen: NAD, alert, cooperative with exam, well-appearing ENT:  normal lips, normal nasal mucosa,  Eye: normal EOM, normal conjunctiva and lids CV:  no edema, +2 pedal pulses   Resp: no accessory muscle use, non-labored,  Skin: no rashes, no areas of induration  Neuro: normal tone, normal sensation to touch Psych:  normal insight, alert and oriented MSK:  Right wrist: No signs of atrophy. Normal range of motion. Normal grip strength. Normal pincer grasp. Normal strength resistance with thumb extension. Normal finger abduction abduction strength resistance. No tenderness palpation over the snuffbox. Negative Finkelstein's test. Some tenderness over the thenar eminence. Neurovascular intact   Limited ultrasound: Right wrist:  CMC joint with no significant effusion.  There is small spurs noted. Normal-appearing first dorsal  compartment. Appears to have a small effusion at the trapezium and the scaphoid with hypoechoic lesions at this site.  Summary: Possible gouty change at the joint between the trapezium and scaphoid.  Ultrasound and interpretation by Clearance Coots, MD   ASSESSMENT & PLAN:   Right wrist pain [facility] does not have significant degenerative changes.  Possible that she might have a small gouty flare at the trapezium and scaphoid.  Symptoms been ongoing since June. -Provided samples of Vimovo. -Vimovo sent. -X-ray -If no improvement would consider an injection into this area. -Could obtain a uric acid  The above documentation has been reviewed and is accurate and complete. Clearance Coots, MD 05/19/2018, 11:06 AM>

## 2018-05-19 NOTE — Patient Instructions (Addendum)
Nice to meet you  Please try the medication. Take one pill twice daily Please take the medication for two days after your last pain.  Please follow up with me in 2-3 weeks if your pain isn't improved.

## 2018-05-19 NOTE — Assessment & Plan Note (Signed)
[facility] does not have significant degenerative changes.  Possible that she might have a small gouty flare at the trapezium and scaphoid.  Symptoms been ongoing since June. -Provided samples of Vimovo. -Vimovo sent. -X-ray -If no improvement would consider an injection into this area. -Could obtain a uric acid

## 2018-05-20 ENCOUNTER — Telehealth: Payer: Self-pay | Admitting: Family Medicine

## 2018-05-20 NOTE — Telephone Encounter (Signed)
Spoke with patient about her xrays.   Rosemarie Ax, MD [facility] 05/20/2018, 10:51 AM

## 2018-05-21 ENCOUNTER — Other Ambulatory Visit: Payer: Self-pay

## 2018-05-21 ENCOUNTER — Encounter: Payer: Self-pay | Admitting: Family Medicine

## 2018-05-21 DIAGNOSIS — E559 Vitamin D deficiency, unspecified: Secondary | ICD-10-CM

## 2018-05-21 MED ORDER — VITAMIN D (ERGOCALCIFEROL) 1.25 MG (50000 UNIT) PO CAPS: 50000.0000 [IU] | ORAL_CAPSULE | ORAL | 0 refills | Status: DC

## 2018-05-21 NOTE — Progress Notes (Signed)
Sent in Vit-Santez [place] [id number]/created future order for lab/advised via MyChart/thx dmf

## 2018-08-06 ENCOUNTER — Other Ambulatory Visit: Payer: Self-pay | Admitting: Family Medicine

## 2018-10-28 ENCOUNTER — Telehealth: Payer: Self-pay | Admitting: Family Medicine

## 2018-10-28 NOTE — Telephone Encounter (Signed)
Voicemail message left by [facility] to request a refill of  Vitamin D, Ergocalciferol, (DRISDOL) 1.25 MG (50000 UT) CAPS capsule. Contact number for pharmacy is [phone].

## 2018-10-30 NOTE — Telephone Encounter (Signed)
Sent in Rx/thx dmf

## 2019-06-12 NOTE — Progress Notes (Signed)
Subjective:   Patient ID: Ana Johnson, female    DOB: 09-08-1970, 48 y.o.   MRN: [id number]  Ana Johnson is a pleasant 48 y.o. year old female who presents to clinic today with Annual Exam (Patient is here today for a CPe without PAP.  She sees Dr. Carlis Abbott for this and has IUD in place.  Last Mammogram on 10.11.19 and last PAP 2017.  I have faxed [facility] for updated records for continuity of carel.  She is due for a flu shot today. ) and Dizziness (Pt c/o having intermitted veritgo/dizziness.)  on 06/14/2019  HPI:  Health Maintenance  Topic Date Due  . PAP SMEAR-Modifier  04/12/2019  . TETANUS/TDAP  05/14/2027  . INFLUENZA VACCINE  Completed  . HIV Screening  Completed   Has GYN- Dr. Carlis Abbott- had pap smear in 2017. Mammogram 05/08/18 Has paraguard IUD  Doing well- her teenage daughter has been doing well.  She is having intermittent vertigo/dizziness.  Usually when she first lays down in bed or gets up.  No nausea vomiting.  No other focal neurological deficits.   Depression screen [facility] 2/9 06/14/2019 05/18/2018 05/13/2017  Decreased Interest 0 0 0  Down, Depressed, Hopeless 0 0 0  PHQ - 2 Score 0 0 0  Altered sleeping 0 - -  Tired, decreased energy 0 - -  Change in appetite 0 - -  Feeling bad or failure about yourself  0 - -  Trouble concentrating 0 - -  Moving slowly or fidgety/restless 0 - -  Suicidal thoughts 0 - -  PHQ-9 Score 0 - -  Difficult doing work/chores Not difficult at all - -   GAD 7 : Generalized Anxiety Score 06/14/2019  Nervous, Anxious, on Edge 0  Control/stop worrying 0  Worry too much - different things 0  Trouble relaxing 0  Restless 0  Easily annoyed or irritable 0  Afraid - awful might happen 0  Total GAD 7 Score 0  Anxiety Difficulty Not difficult at all       Does have a h/o Vitamin D deficiency.  She did take another round of high dose weekly vitamin D in 10/2018.  Lab Results  Component Value Date   VD25OH 17.4 (L)  05/18/2018   VD25OH 22.7 (L) 05/17/2015   VD25OH 20.5 (L) 03/16/2015     Obesity- she has tried weight watchers in the past.  I did refer her to healthy weight and wellness clinic last year.  She is also doing weight watchers on zoom now. Wt Readings from Last 3 Encounters:  06/14/19 283 lb 12.8 oz (128.7 kg)  05/18/18 298 lb (135.2 kg)  05/13/17 296 lb 12.8 oz (134.6 kg)      Current Outpatient Medications on File Prior to Visit  Medication Sig Dispense Refill  . Ascorbic Acid (VITAMIN C) 100 MG tablet Take 100 mg by mouth daily.    . cholecalciferol (VITAMIN D3) 25 MCG (1000 UT) tablet Take 1,000 Units by mouth daily.    Marland Kitchen PARAGARD INTRAUTERINE COPPER IUD IUD ParaGard T 380A 380 square mm intrauterine device    . vitamin B-12 (CYANOCOBALAMIN) 500 MCG tablet Take 500 mcg by mouth daily.     No current facility-administered medications on file prior to visit.     No Known Allergies  History reviewed. No pertinent past medical history.  Past Surgical History:  Procedure Laterality Date  . CESAREAN SECTION    . CHOLECYSTECTOMY    . WISDOM TOOTH  EXTRACTION      Family History  Adopted: Yes    Social History   Socioeconomic History  . Marital status: Married    Spouse name: Not on file  . Number of children: Not on file  . Years of education: Not on file  . Highest education level: Not on file  Occupational History  . Not on file  Social Needs  . Financial resource strain: Not on file  . Food insecurity    Worry: Not on file    Inability: Not on file  . Transportation needs    Medical: Not on file    Non-medical: Not on file  Tobacco Use  . Smoking status: Never Smoker  . Smokeless tobacco: Never Used  Substance and Sexual Activity  . Alcohol use: No  . Drug use: No  . Sexual activity: Yes    Birth control/protection: I.U.D.  Lifestyle  . Physical activity    Days per week: Not on file    Minutes per session: Not on file  . Stress: Not on file   Relationships  . Social Herbalist on phone: Not on file    Gets together: Not on file    Attends religious service: Not on file    Active member of club or organization: Not on file    Attends meetings of clubs or organizations: Not on file    Relationship status: Not on file  . Intimate partner violence    Fear of current or ex partner: Not on file    Emotionally abused: Not on file    Physically abused: Not on file    Forced sexual activity: Not on file  Other Topics Concern  . 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   The PMH, PSH, Social History, Family History, Medications, and allergies have been reviewed in [facility], and have been updated if relevant.   Review of Systems  Constitutional: Negative for fatigue.  HENT: Negative.   Eyes: Negative.   Respiratory: Negative.   Cardiovascular: Negative.   Gastrointestinal: Negative.   Endocrine: Negative.   Genitourinary: Negative.   Musculoskeletal: Negative.   Allergic/Immunologic: Negative.   Neurological: Positive for dizziness. Negative for tremors, seizures, syncope, facial asymmetry, speech difficulty, weakness, light-headedness, numbness and headaches.  Hematological: Negative.   Psychiatric/Behavioral: Negative.   All other systems reviewed and are negative.      Objective:    BP 140/82   Pulse 88   Temp 98.4 F (36.9 C) (Oral)   Ht 5' 2"  (1.575 m)   Wt 283 lb 12.8 oz (128.7 kg)   SpO2 99%   BMI 51.91 kg/m    Physical Exam    General:  Well-developed,well-nourished,in no acute distress; alert,appropriate and cooperative throughout examination Head:  normocephalic and atraumatic.   Eyes:  vision grossly intact, PERRL, horizontal Ears:  R ear normal and L ear normal externally, TMs clear bilaterally Nose:  no external deformity.   Mouth:  good dentition.   Neck:  No deformities,  masses, or tenderness noted. Breasts:  No mass, nodules, thickening, tenderness, bulging, retraction, inflamation, nipple discharge or skin changes noted.   Lungs:  Normal respiratory effort, chest expands symmetrically. Lungs are clear to auscultation, no crackles or wheezes. Heart:  Normal rate and regular rhythm. S1 and S2 normal without gallop, murmur, click,  rub or other extra sounds. Abdomen:  Bowel sounds positive,abdomen soft and non-tender without masses, organomegaly or hernias noted. Msk:  No deformity or scoliosis noted of thoracic or lumbar spine.   Extremities:  No clubbing, cyanosis, edema, or deformity noted with normal full range of motion of all joints.   Neurologic:  alert & oriented X3 and gait normal.   Skin:  Intact without suspicious lesions or rashes Cervical Nodes:  No lymphadenopathy noted Axillary Nodes:  No palpable lymphadenopathy Psych:  Cognition and judgment appear intact. Alert and cooperative with normal attention span and concentration. No apparent delusions, illusions, hallucinations       Assessment & Plan:   Well woman exam without gynecological exam  Vitamin D deficiency - Plan: Vitamin D 1,25 dihydroxy  Hyperlipidemia, unspecified hyperlipidemia type - Plan: Well woman- non DM- CBC, Well woman- non DM- CMET, Well woman- non DM- lipid, well woman- non DM- TSH  BMI 50.0-59.9, adult ([place])  Need for immunization against influenza - Plan: Flu Vaccine QUAD 36+ mos IM  Vertigo No follow-ups on file.

## 2019-06-12 NOTE — Patient Instructions (Addendum)
Great to see you. I will call you with your lab results from today and you can view them online.   How to Perform the Epley Maneuver The Epley maneuver is an exercise that relieves symptoms of vertigo. Vertigo is the feeling that you or your surroundings are moving when they are not. When you feel vertigo, you may feel like the room is spinning and have trouble walking. Dizziness is a little different than vertigo. When you are dizzy, you may feel unsteady or light-headed. You can do this maneuver at home whenever you have symptoms of vertigo. You can do it up to 3 times a day until your symptoms go away. Even though the Epley maneuver may relieve your vertigo for a few weeks, it is possible that your symptoms will return. This maneuver relieves vertigo, but it does not relieve dizziness. What are the risks? If it is done correctly, the Epley maneuver is considered safe. Sometimes it can lead to dizziness or nausea that goes away after a short time. If you develop other symptoms, such as changes in vision, weakness, or numbness, stop doing the maneuver and call your health care provider. How to perform the Epley maneuver 1. Sit on the edge of a bed or table with your back straight and your legs extended or hanging over the edge of the bed or table. 2. Turn your head halfway toward the affected ear or side. 3. Lie backward quickly with your head turned until you are lying flat on your back. You may want to position a pillow under your shoulders. 4. Hold this position for 30 seconds. You may experience an attack of vertigo. This is normal. 5. Turn your head to the opposite direction until your unaffected ear is facing the floor. 6. Hold this position for 30 seconds. You may experience an attack of vertigo. This is normal. Hold this position until the vertigo stops. 7. Turn your whole body to the same side as your head. Hold for another 30 seconds. 8. Sit back up. You can repeat this exercise up to 3  times a day. Follow these instructions at home:  After doing the Epley maneuver, you can return to your normal activities.  Ask your health care provider if there is anything you should do at home to prevent vertigo. He or she may recommend that you: ? Keep your head raised (elevated) with two or more pillows while you sleep. ? Do not sleep on the side of your affected ear. ? Get up slowly from bed. ? Avoid sudden movements during the day. ? Avoid extreme head movement, like looking up or bending over. Contact a health care provider if:  Your vertigo gets worse.  You have other symptoms, including: ? Nausea. ? Vomiting. ? Headache. Get help right away if:  You have vision changes.  You have a severe or worsening headache or neck pain.  You cannot stop vomiting.  You have new numbness or weakness in any part of your body. Summary  Vertigo is the feeling that you or your surroundings are moving when they are not.  The Epley maneuver is an exercise that relieves symptoms of vertigo.  If the Epley maneuver is done correctly, it is considered safe. You can do it up to 3 times a day.   Vertigo Vertigo is the feeling that you or your surroundings are moving when they are not. This feeling can come and go at any time. Vertigo often goes away on its own. Vertigo  can be dangerous if it occurs while you are doing something that could endanger you or others, such as driving or operating machinery. Your health care provider will do tests to determine the cause of your vertigo. Tests will also help your health care provider decide how best to treat your condition. Follow these instructions at home: Eating and drinking      Drink enough fluid to keep your urine pale yellow.  Do not drink alcohol. Activity  Return to your normal activities as told by your health care provider. Ask your health care provider what activities are safe for you.  In the morning, first sit up on the  side of the bed. When you feel okay, stand slowly while you hold onto something until you know that your balance is fine.  Move slowly. Avoid sudden body or head movements or certain positions, as told by your health care provider.  If you have trouble walking or keeping your balance, try using a cane for stability. If you feel dizzy or unstable, sit down right away.  Avoid doing any tasks that would cause danger to you or others if vertigo occurs.  Avoid bending down if you feel dizzy. Place items in your home so that they are easy for you to reach without leaning over.  Do not drive or use heavy machinery if you feel dizzy. General instructions  Take over-the-counter and prescription medicines only as told by your health care provider.  Keep all follow-up visits as told by your health care provider. This is important. Contact a health care provider if:  Your medicines do not relieve your vertigo or they make it worse.  You have a fever.  Your condition gets worse or you develop new symptoms.  Your family or friends notice any behavioral changes.  Your nausea or vomiting gets worse.  You have numbness or a prickling and tingling sensation in part of your body. Get help right away if you:  Have difficulty moving or speaking.  Are always dizzy.  Faint.  Develop severe headaches.  Have weakness in your hands, arms, or legs.  Have changes in your hearing or vision.  Develop a stiff neck.  Develop sensitivity to light. Summary  Vertigo is the feeling that you or your surroundings are moving when they are not.  Your health care provider will do tests to determine the cause of your vertigo.  Follow instructions for home care. You may be told to avoid certain tasks, positions, or movements.  Contact a health care provider if your medicines do not relieve your symptoms, or if you have a fever, nausea, vomiting, or changes in behavior.  Get help right away if you have  severe headaches or difficulty speaking, or you develop hearing or vision problems. This information is not intended to replace advice given to you by your health care provider. Make sure you discuss any questions you have with your health care provider. Document Released: 04/24/2005 Document Revised: 06/08/2018 Document Reviewed: 06/08/2018 Elsevier Patient Education  2020 [employer].

## 2019-06-14 ENCOUNTER — Ambulatory Visit (INDEPENDENT_AMBULATORY_CARE_PROVIDER_SITE_OTHER): Payer: Federal, State, Local not specified - PPO | Admitting: Family Medicine

## 2019-06-14 ENCOUNTER — Other Ambulatory Visit: Payer: Self-pay

## 2019-06-14 ENCOUNTER — Encounter: Payer: Self-pay | Admitting: Family Medicine

## 2019-06-14 VITALS — BP 140/82 | HR 88 | Temp 98.4°F | Ht 62.0 in | Wt 283.8 lb

## 2019-06-14 DIAGNOSIS — Z6841 Body Mass Index (BMI) 40.0 and over, adult: Secondary | ICD-10-CM

## 2019-06-14 DIAGNOSIS — Z23 Encounter for immunization: Secondary | ICD-10-CM | POA: Diagnosis not present

## 2019-06-14 DIAGNOSIS — E785 Hyperlipidemia, unspecified: Secondary | ICD-10-CM

## 2019-06-14 DIAGNOSIS — Z0000 Encounter for general adult medical examination without abnormal findings: Secondary | ICD-10-CM

## 2019-06-14 DIAGNOSIS — E559 Vitamin D deficiency, unspecified: Secondary | ICD-10-CM

## 2019-06-14 DIAGNOSIS — R42 Dizziness and giddiness: Secondary | ICD-10-CM | POA: Diagnosis not present

## 2019-06-14 NOTE — Assessment & Plan Note (Signed)
Congratulated her on her success.

## 2019-06-14 NOTE — Addendum Note (Signed)
Addended by: Lynnea Ferrier on: 06/14/2019 02:10 PM   Modules accepted: Orders

## 2019-06-14 NOTE — Assessment & Plan Note (Signed)
Check Vit D today.

## 2019-06-14 NOTE — Addendum Note (Signed)
Addended by: Lynnea Ferrier on: 06/14/2019 02:30 PM   Modules accepted: Orders

## 2019-06-14 NOTE — Assessment & Plan Note (Signed)
Reviewed preventive care protocols, scheduled due services, and updated immunizations Discussed nutrition, exercise, diet, and healthy lifestyle.  Flu shot given today.  Orders Placed This Encounter  Procedures  . Flu Vaccine QUAD 36+ mos IM  . Well woman- non DM- CBC  . Well woman- non DM- CMET  . Well woman- non DM- lipid  . well woman- non DM- TSH  . Vitamin D 1,25 dihydroxy

## 2019-06-14 NOTE — Assessment & Plan Note (Signed)
Horizontal nystagmus on dix hallpike. Given eply maneuvers to do at home.  Call or send my chart message prn if these symptoms worsen or fail to improve as anticipated. The patient indicates understanding of these issues and agrees with the plan.

## 2019-06-15 ENCOUNTER — Other Ambulatory Visit: Payer: Federal, State, Local not specified - PPO

## 2019-06-15 DIAGNOSIS — E785 Hyperlipidemia, unspecified: Secondary | ICD-10-CM

## 2019-06-15 DIAGNOSIS — R42 Dizziness and giddiness: Secondary | ICD-10-CM | POA: Diagnosis not present

## 2019-06-15 LAB — COMPREHENSIVE METABOLIC PANEL
ALT: 11 IU/L (ref 0–32)
AST: 20 IU/L (ref 0–40)
Albumin/Globulin Ratio: 1.4 (ref 1.2–2.2)
Albumin: 4.4 g/dL (ref 3.8–4.8)
Alkaline Phosphatase: 89 IU/L (ref 39–117)
BUN/Creatinine Ratio: 10 (ref 9–23)
BUN: 8 mg/dL (ref 6–24)
Bilirubin Total: 0.5 mg/dL (ref 0.0–1.2)
CO2: 16 mmol/L — ABNORMAL LOW (ref 20–29)
Calcium: 10.1 mg/dL (ref 8.7–10.2)
Chloride: 109 mmol/L — ABNORMAL HIGH (ref 96–106)
Creatinine, Ser: 0.83 mg/dL (ref 0.57–1.00)
GFR calc Af Amer: 98 mL/min/{1.73_m2} (ref >59)
GFR calc non Af Amer: 85 mL/min/{1.73_m2} (ref >59)
Globulin, Total: 3.2 g/dL (ref 1.5–4.5)
Glucose: 87 mg/dL (ref 65–99)
Potassium: 5 mmol/L (ref 3.5–5.2)
Sodium: 145 mmol/L — ABNORMAL HIGH (ref 134–144)
Total Protein: 7.6 g/dL (ref 6.0–8.5)

## 2019-06-15 LAB — SPECIMEN STATUS REPORT

## 2019-06-15 LAB — TSH: TSH: 1.4 u[IU]/mL (ref 0.450–4.500)

## 2019-06-15 LAB — VITAMIN D 25 HYDROXY (VIT D DEFICIENCY, FRACTURES): Vit D, 25-Hydroxy: 31.2 ng/mL (ref 30.0–100.0)

## 2019-06-16 LAB — CBC WITH DIFFERENTIAL/PLATELET
Basophils Absolute: 0.1 10*3/uL (ref 0.0–0.2)
Basos: 1 %
EOS (ABSOLUTE): 0.2 10*3/uL (ref 0.0–0.4)
Eos: 2 %
Hematocrit: 40.8 % (ref 34.0–46.6)
Hemoglobin: 13.7 g/dL (ref 11.1–15.9)
Immature Grans (Abs): 0 10*3/uL (ref 0.0–0.1)
Immature Granulocytes: 0 %
Lymphocytes Absolute: 2.4 10*3/uL (ref 0.7–3.1)
Lymphs: 23 %
MCH: 28.1 pg (ref 26.6–33.0)
MCHC: 33.6 g/dL (ref 31.5–35.7)
MCV: 84 fL (ref 79–97)
Monocytes Absolute: 0.7 10*3/uL (ref 0.1–0.9)
Monocytes: 7 %
Neutrophils Absolute: 7.3 10*3/uL — ABNORMAL HIGH (ref 1.4–7.0)
Neutrophils: 67 %
Platelets: 326 10*3/uL (ref 150–450)
RBC: 4.88 x10E6/uL (ref 3.77–5.28)
RDW: 14.7 % (ref 11.7–15.4)
WBC: 10.7 10*3/uL (ref 3.4–10.8)

## 2019-06-16 LAB — LIPID PANEL
Chol/HDL Ratio: 3.5 ratio (ref 0.0–4.4)
Cholesterol, Total: 189 mg/dL (ref 100–199)
HDL: 54 mg/dL (ref >39)
LDL Chol Calc (NIH): 118 mg/dL — ABNORMAL HIGH (ref 0–99)
Triglycerides: 96 mg/dL (ref 0–149)
VLDL Cholesterol Cal: 17 mg/dL (ref 5–40)

## 2019-06-16 LAB — FERRITIN: Ferritin: 58 ng/mL (ref 15–150)

## 2019-07-18 IMAGING — DX DG WRIST COMPLETE 3+V*R*
4 series · 4 of 4 positions shown · non-contrast
Comparison: None.

CLINICAL DATA: Right thumb pain for 3 months.

EXAM:
RIGHT WRIST - COMPLETE 3+ VIEW

[wrist pa]
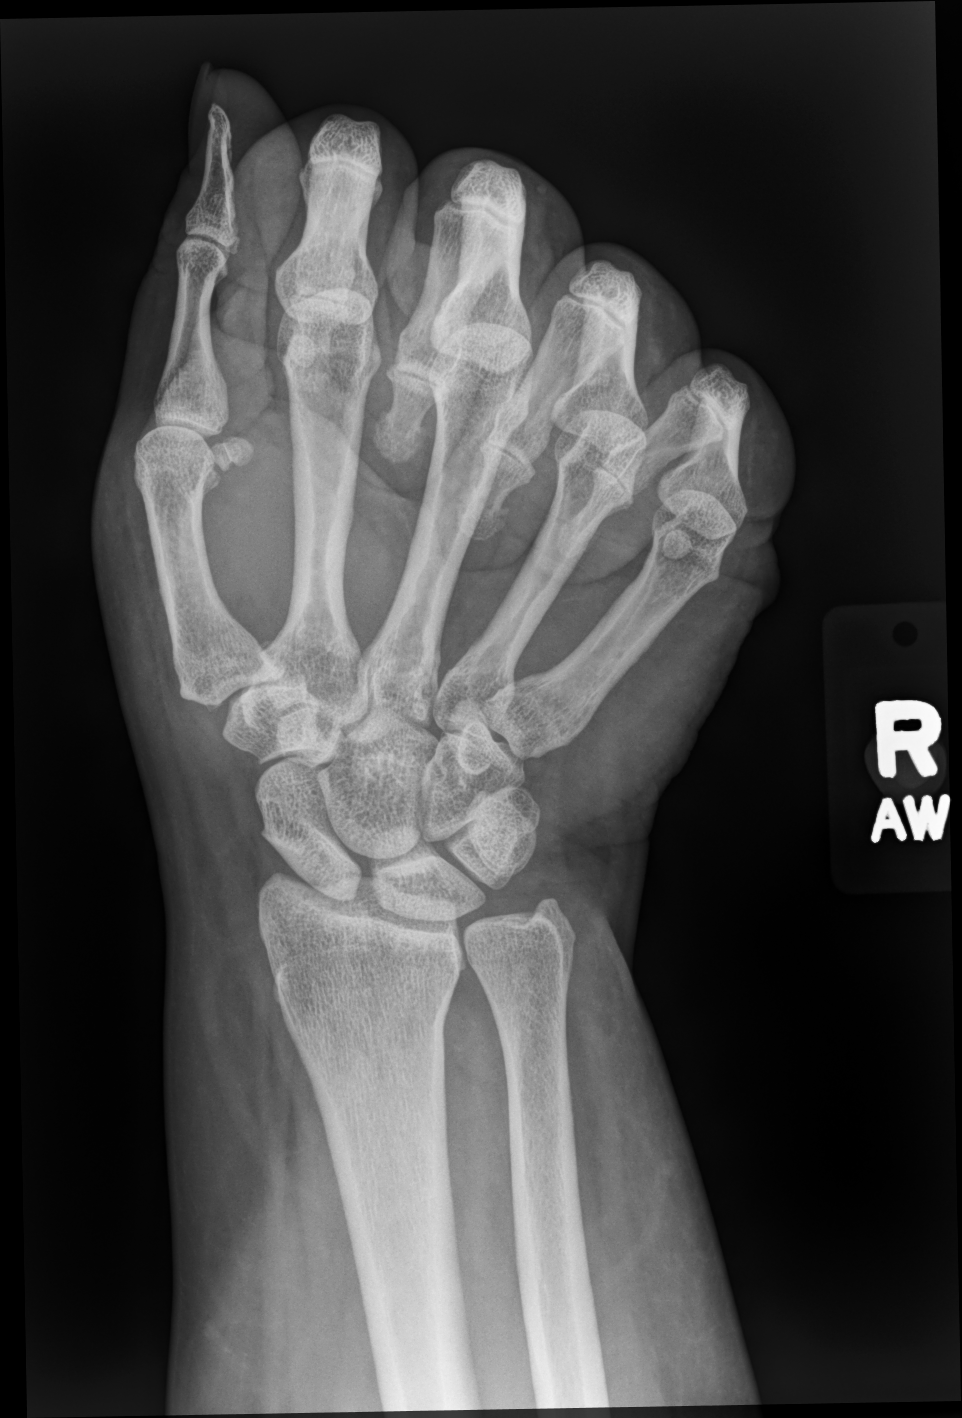

[wrist mlo]
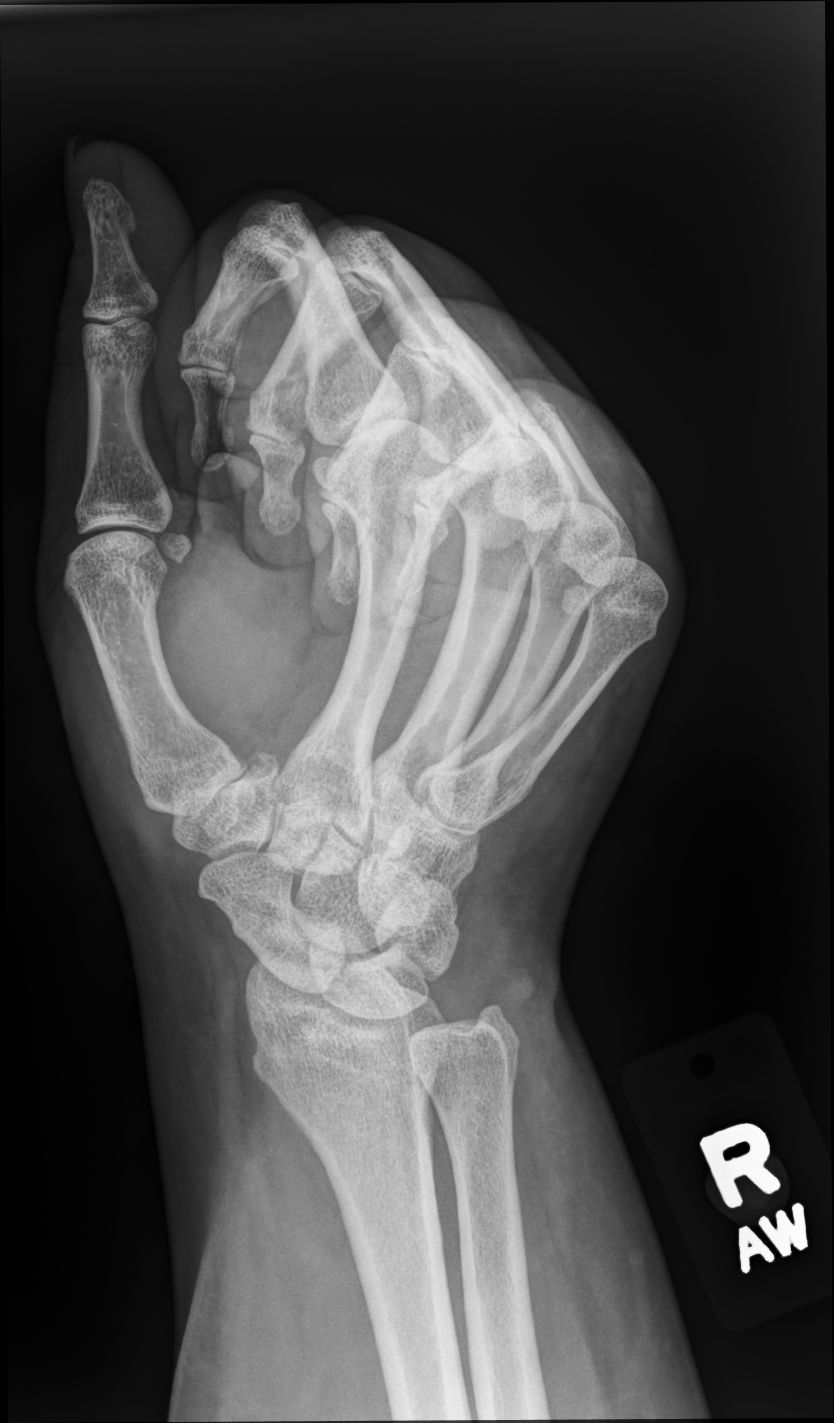

[wrist lat]
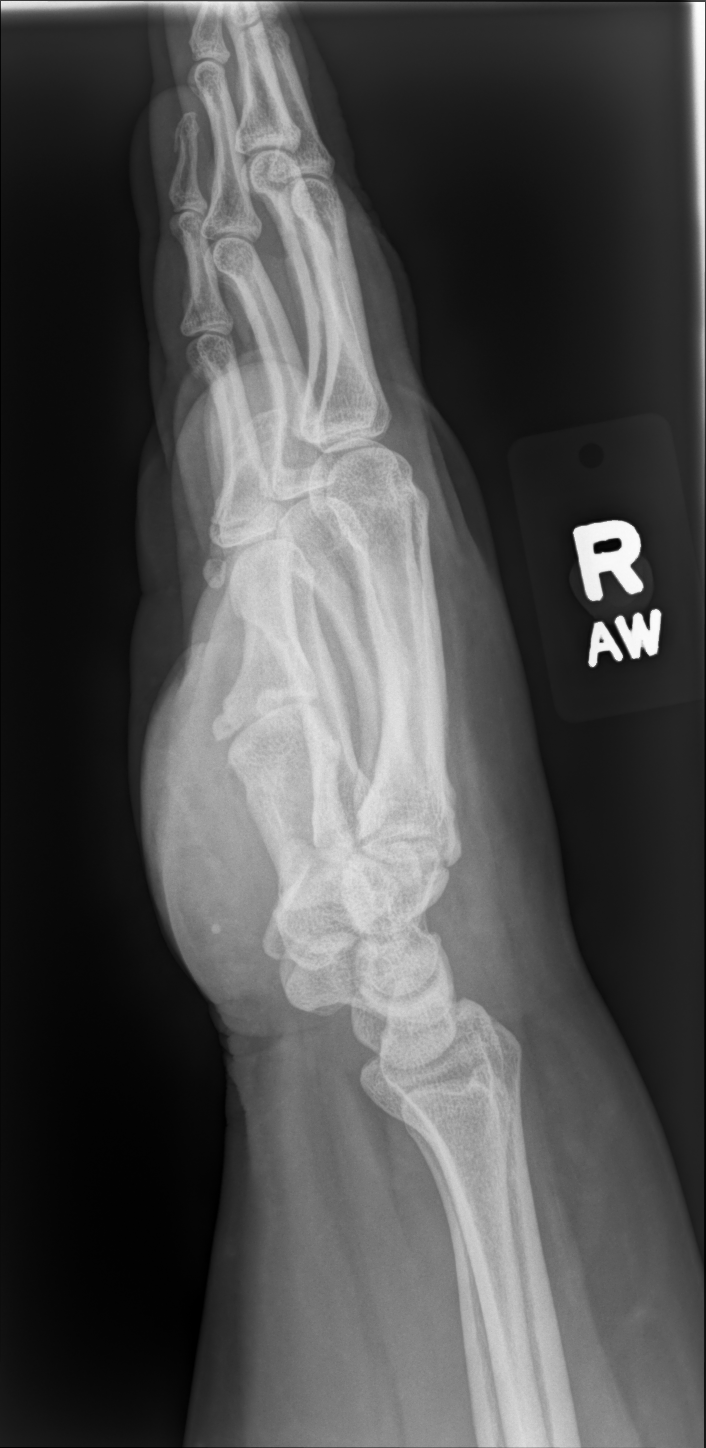

[wrist (navicular) pa]
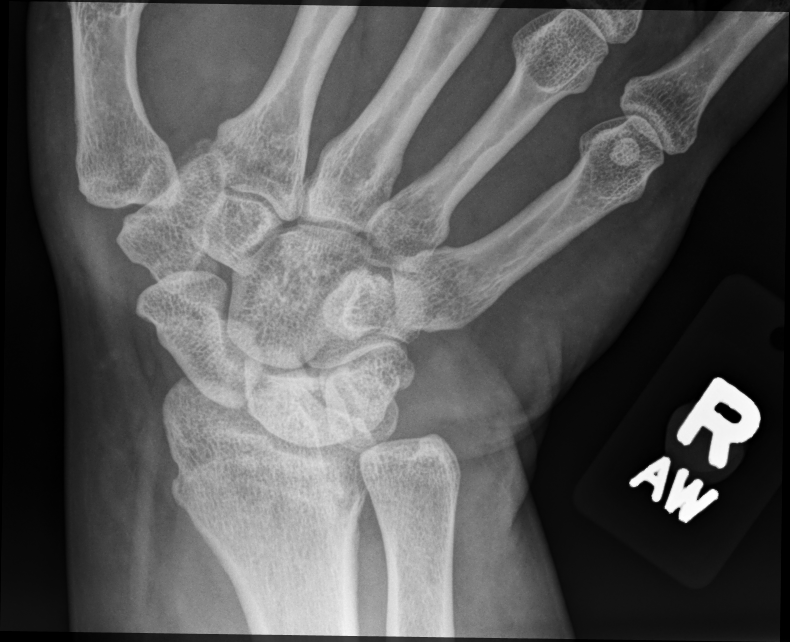

[4 of 4 positions shown; findings below may reference images not displayed]

FINDINGS: There is no evidence of fracture or dislocation. Mild narrowing and
osteophyte formation of the first carpometacarpal joint is noted.
Soft tissues are unremarkable.
IMPRESSION: Mild osteoarthritis of the first carpometacarpal joint. No acute
abnormality seen in the right wrist.

## 2019-08-26 ENCOUNTER — Encounter: Payer: Self-pay | Admitting: Family Medicine

## 2019-08-26 ENCOUNTER — Other Ambulatory Visit: Payer: Self-pay

## 2019-08-26 DIAGNOSIS — R300 Dysuria: Secondary | ICD-10-CM

## 2019-08-26 NOTE — Progress Notes (Signed)
Created future urine orders per TA/thx dmf

## 2019-08-27 ENCOUNTER — Other Ambulatory Visit (INDEPENDENT_AMBULATORY_CARE_PROVIDER_SITE_OTHER): Payer: Federal, State, Local not specified - PPO

## 2019-08-27 DIAGNOSIS — R300 Dysuria: Secondary | ICD-10-CM | POA: Diagnosis not present

## 2019-08-27 LAB — URINALYSIS, ROUTINE W REFLEX MICROSCOPIC
Bilirubin Urine: NEGATIVE
Hgb urine dipstick: NEGATIVE
Ketones, ur: NEGATIVE
Leukocytes,Ua: NEGATIVE
Nitrite: NEGATIVE
Specific Gravity, Urine: >=1.03 — AB (ref 1.000–1.030)
Total Protein, Urine: NEGATIVE
Urine Glucose: NEGATIVE
Urobilinogen, UA: 0.2 (ref 0.0–1.0)
pH: 5 (ref 5.0–8.0)

## 2019-08-27 MED ORDER — CEPHALEXIN 500 MG PO CAPS: 500.0000 mg | ORAL_CAPSULE | Freq: Two times a day (BID) | ORAL | 0 refills | Status: AC

## 2019-08-27 NOTE — Addendum Note (Signed)
Addended by: Darral Dash on: 08/27/2019 04:34 PM   Modules accepted: Orders

## 2019-10-06 LAB — HM PAP SMEAR

## 2019-10-06 LAB — RESULTS CONSOLE HPV: CHL HPV: NEGATIVE

## 2019-10-07 DIAGNOSIS — Z6841 Body Mass Index (BMI) 40.0 and over, adult: Secondary | ICD-10-CM | POA: Diagnosis not present

## 2019-10-07 DIAGNOSIS — Z124 Encounter for screening for malignant neoplasm of cervix: Secondary | ICD-10-CM | POA: Diagnosis not present

## 2019-10-07 DIAGNOSIS — Z1231 Encounter for screening mammogram for malignant neoplasm of breast: Secondary | ICD-10-CM | POA: Diagnosis not present

## 2019-10-07 DIAGNOSIS — Z01419 Encounter for gynecological examination (general) (routine) without abnormal findings: Secondary | ICD-10-CM | POA: Diagnosis not present

## 2020-06-08 DIAGNOSIS — Z1152 Encounter for screening for COVID-19: Secondary | ICD-10-CM | POA: Diagnosis not present

## 2020-06-08 DIAGNOSIS — Z03818 Encounter for observation for suspected exposure to other biological agents ruled out: Secondary | ICD-10-CM | POA: Diagnosis not present

## 2020-06-14 ENCOUNTER — Encounter: Payer: Federal, State, Local not specified - PPO | Admitting: Nurse Practitioner

## 2020-08-04 ENCOUNTER — Encounter: Payer: Federal, State, Local not specified - PPO | Admitting: Nurse Practitioner

## 2020-10-24 ENCOUNTER — Other Ambulatory Visit: Payer: Self-pay

## 2020-10-25 ENCOUNTER — Encounter: Payer: Self-pay | Admitting: Nurse Practitioner

## 2020-10-25 ENCOUNTER — Ambulatory Visit (INDEPENDENT_AMBULATORY_CARE_PROVIDER_SITE_OTHER): Payer: Federal, State, Local not specified - PPO | Admitting: Nurse Practitioner

## 2020-10-25 VITALS — BP 136/90 | HR 98 | Temp 97.2°F | Ht 62.0 in | Wt 307.6 lb

## 2020-10-25 DIAGNOSIS — Z0000 Encounter for general adult medical examination without abnormal findings: Secondary | ICD-10-CM

## 2020-10-25 DIAGNOSIS — Z13220 Encounter for screening for lipoid disorders: Secondary | ICD-10-CM | POA: Diagnosis not present

## 2020-10-25 DIAGNOSIS — Z136 Encounter for screening for cardiovascular disorders: Secondary | ICD-10-CM | POA: Diagnosis not present

## 2020-10-25 DIAGNOSIS — Z1211 Encounter for screening for malignant neoplasm of colon: Secondary | ICD-10-CM

## 2020-10-25 DIAGNOSIS — Z6841 Body Mass Index (BMI) 40.0 and over, adult: Secondary | ICD-10-CM

## 2020-10-25 DIAGNOSIS — E559 Vitamin D deficiency, unspecified: Secondary | ICD-10-CM

## 2020-10-25 DIAGNOSIS — Z0001 Encounter for general adult medical examination with abnormal findings: Secondary | ICD-10-CM | POA: Diagnosis not present

## 2020-10-25 NOTE — Patient Instructions (Addendum)
Start DASH diet, small meal portions and daily exercise (brisk walking 88mns). This will help promote weight loss and prevent healthy complications like hypertension, diabetes, cancer, heart disease etc.  Sign medical release to get records from GYN.  I entered cologuard order. Call office if you not get kit in 2weeks.  Go to lab for blood draw.  Exercising to Stay Healthy To become healthy and stay healthy, it is recommended that you do moderate-intensity and vigorous-intensity exercise. You can tell that you are exercising at a moderate intensity if your heart starts beating faster and you start breathing faster but can still hold a conversation. You can tell that you are exercising at a vigorous intensity if you are breathing much harder and faster and cannot hold a conversation while exercising. Exercising regularly is important. It has many health benefits, such as:  Improving overall fitness, flexibility, and endurance.  Increasing bone density.  Helping with weight control.  Decreasing body fat.  Increasing muscle strength.  Reducing stress and tension.  Improving overall health. How often should I exercise? Choose an activity that you enjoy, and set realistic goals. Your health care provider can help you make an activity plan that works for you. Exercise regularly as told by your health care provider. This may include:  Doing strength training two times a week, such as: ? Lifting weights. ? Using resistance bands. ? Push-ups. ? Sit-ups. ? Yoga.  Doing a certain intensity of exercise for a given amount of time. Choose from these options: ? A total of 150 minutes of moderate-intensity exercise every week. ? A total of 75 minutes of vigorous-intensity exercise every week. ? A mix of moderate-intensity and vigorous-intensity exercise every week. Children, pregnant women, people who have not exercised regularly, people who are overweight, and older adults may need to talk  with a health care provider about what activities are safe to do. If you have a medical condition, be sure to talk with your health care provider before you start a new exercise program. What are some exercise ideas? Moderate-intensity exercise ideas include:  Walking 1 mile (1.6 km) in about 15 minutes.  Biking.  Hiking.  Golfing.  Dancing.  Water aerobics. Vigorous-intensity exercise ideas include:  Walking 4.5 miles (7.2 km) or more in about 1 hour.  Jogging or running 5 miles (8 km) in about 1 hour.  Biking 10 miles (16.1 km) or more in about 1 hour.  Lap swimming.  Roller-skating or in-line skating.  Cross-country skiing.  Vigorous competitive sports, such as football, basketball, and soccer.  Jumping rope.  Aerobic dancing.   What are some everyday activities that can help me to get exercise?  [place], such as: ? Pushing a lConservation officer, nature ? Raking and bagging leaves.  Washing your car.  Pushing a stroller.  Shoveling snow.  Gardening.  Washing windows or floors. How can I be more active in my day-to-day activities?  Use stairs instead of an elevator.  Take a walk during your lunch break.  If you drive, park your car farther away from your work or school.  If you take public transportation, get off one stop early and walk the rest of the way.  Stand up or walk around during all of your indoor phone calls.  Get up, stretch, and walk around every 30 minutes throughout the day.  Enjoy exercise with a friend. Support to continue exercising will help you keep a regular routine of activity. What guidelines can I follow while exercising?  Before you start a new exercise program, talk with your health care provider.  Do not exercise so much that you hurt yourself, feel dizzy, or get very short of breath.  Wear comfortable clothes and wear shoes with good support.  Drink plenty of water while you exercise to prevent dehydration or heat stroke.  Work  out until your breathing and your heartbeat get faster. Where to find more information  U.S[place] Department of Health and Human Services: [employer]at  Centers for Disease Control and Prevention (CDC): http://www.wolf.info/ Summary  Exercising regularly is important. It will improve your overall fitness, flexibility, and endurance.  Regular exercise also will improve your overall health. It can help you control your weight, reduce stress, and improve your bone density.  Do not exercise so much that you hurt yourself, feel dizzy, or get very short of breath.  Before you start a new exercise program, talk with your health care provider. This information is not intended to replace advice given to you by your health care provider. Make sure you discuss any questions you have with your health care provider. Document Revised: 06/27/2017 Document Reviewed: 06/05/2017 Elsevier Patient Education  2021 [employer] Calories are units of energy. Your body needs a certain number of calories from food to keep going throughout the day. When you eat or drink more calories than your body needs, your body stores the extra calories mostly as fat. When you eat or drink fewer calories than your body needs, your body burns fat to get the energy it needs. Calorie counting means keeping track of how many calories you eat and drink each day. Calorie counting can be helpful if you need to lose weight. If you eat fewer calories than your body needs, you should lose weight. Ask your health care provider what a healthy weight is for you. For calorie counting to work, you will need to eat the right number of calories each day to lose a healthy amount of weight per week. A dietitian can help you figure out how many calories you need in a day and will suggest ways to reach your calorie goal.  A healthy amount of weight to lose each week is usually 1-2 lb (0.5-0.9 kg). This usually means that your  daily calorie intake should be reduced by 500-750 calories.  Eating 1,200-1,500 calories a day can help most women lose weight.  Eating 1,500-1,800 calories a day can help most men lose weight. What do I need to know about calorie counting? Work with your health care provider or dietitian to determine how many calories you should get each day. To meet your daily calorie goal, you will need to:  Find out how many calories are in each food that you would like to eat. Try to do this before you eat.  Decide how much of the food you plan to eat.  Keep a food log. Do this by writing down what you ate and how many calories it had. To successfully lose weight, it is important to balance calorie counting with a healthy lifestyle that includes regular activity. Where do I find calorie information? The number of calories in a food can be found on a Nutrition Facts label. If a food does not have a Nutrition Facts label, try to look up the calories online or ask your dietitian for help. Remember that calories are listed per serving. If you choose to have more than one serving of a food, you  will have to multiply the calories per serving by the number of servings you plan to eat. For example, the label on a package of bread might say that a serving size is 1 slice and that there are 90 calories in a serving. If you eat 1 slice, you will have eaten 90 calories. If you eat 2 slices, you will have eaten 180 calories.   How do I keep a food log? After each time that you eat, record the following in your food log as soon as possible:  What you ate. Be sure to include toppings, sauces, and other extras on the food.  How much you ate. This can be measured in cups, ounces, or number of items.  How many calories were in each food and drink.  The total number of calories in the food you ate. Keep your food log near you, such as in a pocket-sized notebook or on an app or website on your mobile phone. Some programs  will calculate calories for you and show you how many calories you have left to meet your daily goal. What are some portion-control tips?  Know how many calories are in a serving. This will help you know how many servings you can have of a certain food.  Use a measuring cup to measure serving sizes. You could also try weighing out portions on a kitchen scale. With time, you will be able to estimate serving sizes for some foods.  Take time to put servings of different foods on your favorite plates or in your favorite bowls and cups so you know what a serving looks like.  Try not to eat straight from a food's packaging, such as from a bag or box. Eating straight from the package makes it hard to see how much you are eating and can lead to overeating. Put the amount you would like to eat in a cup or on a plate to make sure you are eating the right portion.  Use smaller plates, glasses, and bowls for smaller portions and to prevent overeating.  Try not to multitask. For example, avoid watching TV or using your computer while eating. If it is time to eat, sit down at a table and enjoy your food. This will help you recognize when you are full. It will also help you be more mindful of what and how much you are eating. What are tips for following this plan? Reading food labels  Check the calorie count compared with the serving size. The serving size may be smaller than what you are used to eating.  Check the source of the calories. Try to choose foods that are high in protein, fiber, and vitamins, and low in saturated fat, trans fat, and sodium. Shopping  Read nutrition labels while you shop. This will help you make healthy decisions about which foods to buy.  Pay attention to nutrition labels for low-fat or fat-free foods. These foods sometimes have the same number of calories or more calories than the full-fat versions. They also often have added sugar, starch, or salt to make up for flavor that  was removed with the fat.  Make a grocery list of lower-calorie foods and stick to it. Cooking  Try to cook your favorite foods in a healthier way. For example, try baking instead of frying.  Use low-fat dairy products. Meal planning  Use more fruits and vegetables. One-half of your plate should be fruits and vegetables.  Include lean proteins, such as chicken, [place],  and fish. Lifestyle Each week, aim to do one of the following:  150 minutes of moderate exercise, such as walking.  75 minutes of vigorous exercise, such as running. General information  Know how many calories are in the foods you eat most often. This will help you calculate calorie counts faster.  Find a way of tracking calories that works for you. Get creative. Try different apps or programs if writing down calories does not work for you. What foods should I eat?  Eat nutritious foods. It is better to have a nutritious, high-calorie food, such as an avocado, than a food with few nutrients, such as a bag of potato chips.  Use your calories on foods and drinks that will fill you up and will not leave you hungry soon after eating. ? Examples of foods that fill you up are nuts and nut butters, vegetables, lean proteins, and high-fiber foods such as whole grains. High-fiber foods are foods with more than 5 g of fiber per serving.  Pay attention to calories in drinks. Low-calorie drinks include water and unsweetened drinks. The items listed above may not be a complete list of foods and beverages you can eat. Contact a dietitian for more information.   What foods should I limit? Limit foods or drinks that are not good sources of vitamins, minerals, or protein or that are high in unhealthy fats. These include:  Candy.  Other sweets.  Sodas, specialty coffee drinks, alcohol, and juice. The items listed above may not be a complete list of foods and beverages you should avoid. Contact a dietitian for more  information. How do I count calories when eating out?  Pay attention to portions. Often, portions are much larger when eating out. Try these tips to keep portions smaller: ? Consider sharing a meal instead of getting your own. ? If you get your own meal, eat only half of it. Before you start eating, ask for a container and put half of your meal into it. ? When available, consider ordering smaller portions from the menu instead of full portions.  Pay attention to your food and drink choices. Knowing the way food is cooked and what is included with the meal can help you eat fewer calories. ? If calories are listed on the menu, choose the lower-calorie options. ? Choose dishes that include vegetables, fruits, whole grains, low-fat dairy products, and lean proteins. ? Choose items that are boiled, broiled, grilled, or steamed. Avoid items that are buttered, battered, fried, or served with cream sauce. Items labeled as crispy are usually fried, unless stated otherwise. ? Choose water, low-fat milk, unsweetened iced tea, or other drinks without added sugar. If you want an alcoholic beverage, choose a lower-calorie option, such as a glass of wine or light beer. ? Ask for dressings, sauces, and syrups on the side. These are usually high in calories, so you should limit the amount you eat. ? If you want a salad, choose a garden salad and ask for grilled meats. Avoid extra toppings such as bacon, cheese, or fried items. Ask for the dressing on the side, or ask for olive oil and vinegar or lemon to use as dressing.  Estimate how many servings of a food you are given. Knowing serving sizes will help you be aware of how much food you are eating at restaurants. Where to find more information  Centers for Disease Control and Prevention: http://www.wolf.info/  [place]. Department of Agriculture: http://www.wilson-mendoza.org/ Summary  Calorie counting means keeping track  of how many calories you eat and drink each day. If you eat fewer  calories than your body needs, you should lose weight.  A healthy amount of weight to lose per week is usually 1-2 lb (0.5-0.9 kg). This usually means reducing your daily calorie intake by 500-750 calories.  The number of calories in a food can be found on a Nutrition Facts label. If a food does not have a Nutrition Facts label, try to look up the calories online or ask your dietitian for help.  Use smaller plates, glasses, and bowls for smaller portions and to prevent overeating.  Use your calories on foods and drinks that will fill you up and not leave you hungry shortly after a meal. This information is not intended to replace advice given to you by your health care provider. Make sure you discuss any questions you have with your health care provider. Document Revised: 08/26/2019 Document Reviewed: 08/26/2019 Elsevier Patient Education  2021 [employer].

## 2020-10-25 NOTE — Progress Notes (Signed)
Subjective:    Patient ID: Ana Johnson, female    DOB: [birth year], 50 y.o.   MRN: [id number]  Patient presents today for CPE   HPI  Sexual History (orientation,birth control, marital status, STD):paraguard in place, pelvic and breast exam completed by Dr. Lovena Le with [place], denies need for STD screen. Mammogram completed by GYN  Depression/Suicide: Depression screen [facility] 2/9 10/25/2020 06/14/2019 05/18/2018 05/13/2017  Decreased Interest 0 0 0 0  Down, Depressed, Hopeless 0 0 0 0  PHQ - 2 Score 0 0 0 0  Altered sleeping 0 0 - -  Tired, decreased energy 0 0 - -  Change in appetite 0 0 - -  Feeling bad or failure about yourself  0 0 - -  Trouble concentrating 0 0 - -  Moving slowly or fidgety/restless 0 0 - -  Suicidal thoughts 0 0 - -  PHQ-9 Score 0 0 - -  Difficult doing work/chores - Not difficult at all - -   Vision:up to date  Dental:up to date  Immunizations: (TDAP, Hep C screen, Pneumovax, Influenza, zoster)  Health Maintenance  Topic Date Due  . Colon Cancer Screening  Never done  . Flu Shot  10/26/2020*  .  Hepatitis C: One time screening is recommended by Center for Disease Control  (CDC) for  adults born from [birth year] through 1963.   10/25/2021*  . Pap Smear  10/06/2022  . Tetanus Vaccine  05/14/2027  . COVID-19 Vaccine  Completed  . HIV Screening  Completed  . HPV Vaccine  Aged Out  *Topic was postponed. The date shown is not the original due date.   Diet:regular Exercise: none Weight:  Wt Readings from Last 3 Encounters:  10/25/20 (!) 307 lb 9.6 oz (139.5 kg)  06/14/19 283 lb 12.8 oz (128.7 kg)  05/18/18 298 lb (135.2 kg)   Fall Risk: Fall Risk  10/25/2020 06/14/2019 05/18/2018 05/13/2017  Falls in the past year? 0 0 No No  Number falls in past yr: 0 - - -  Injury with Fall? 0 - - -  Risk for fall due to : No Fall Risks - - -  Follow up Falls evaluation completed Falls evaluation completed - -   Medications and allergies reviewed with  patient and updated if appropriate.  Patient Active Problem List   Diagnosis Date Noted  . Vertigo 06/14/2019  . Encounter for preventative adult health care exam with abnormal findings 05/18/2018  . Vitamin D deficiency 05/18/2018  . BMI 50.0-59.9, adult ([place]) 05/18/2018  . Fatigue 05/18/2018    Current Outpatient Medications on File Prior to Visit  Medication Sig Dispense Refill  . cetirizine (ZYRTEC) 10 MG tablet Take 10 mg by mouth daily.    . Ascorbic Acid (VITAMIN C) 100 MG tablet Take 100 mg by mouth daily.    . cholecalciferol (VITAMIN D3) 25 MCG (1000 UT) tablet Take 1,000 Units by mouth daily.    Marland Kitchen PARAGARD INTRAUTERINE COPPER IUD IUD ParaGard T 380A 380 square mm intrauterine device    . vitamin B-12 (CYANOCOBALAMIN) 500 MCG tablet Take 500 mcg by mouth daily.     No current facility-administered medications on file prior to visit.    History reviewed. No pertinent past medical history.  Past Surgical History:  Procedure Laterality Date  . CESAREAN SECTION    . CHOLECYSTECTOMY    . WISDOM TOOTH EXTRACTION      Social History   Socioeconomic History  . Marital status: Married  Spouse name: Not on file  . Number of children: Not on file  . Years of education: Not on file  . Highest education level: Not on file  Occupational History  . Not on file  Tobacco Use  . Smoking status: Never Smoker  . Smokeless tobacco: Never Used  Vaping Use  . Vaping Use: Never used  Substance and Sexual Activity  . Alcohol use: No  . Drug use: No  . Sexual activity: Yes    Birth control/protection: I.U.D.  Other Topics Concern  . Not on file  Social History Narrative   Tourist information centre manager for [employer]   1 daughter, 19 year old name delaney   Mom and dad live with her- in late 86s.   Social Determinants of Health   Financial Resource Strain: Not on file  Food Insecurity: Not on file  Transportation Needs: Not on file  Physical Activity: Not on file  Stress: Not on  file  Social Connections: Not on file   Family History  Adopted: Yes       Review of Systems  Constitutional: Negative for fever, malaise/fatigue and weight loss.  HENT: Negative for congestion and sore throat.   Eyes:       Negative for visual changes  Respiratory: Negative for cough and shortness of breath.   Cardiovascular: Negative for chest pain, palpitations and leg swelling.  Gastrointestinal: Negative for blood in stool, constipation, diarrhea and heartburn.  Genitourinary: Negative for dysuria, frequency and urgency.  Musculoskeletal: Negative for falls, joint pain and myalgias.  Skin: Negative for rash.  Neurological: Negative for dizziness, sensory change and headaches.  Endo/Heme/Allergies: Does not bruise/bleed easily.  Psychiatric/Behavioral: Negative for depression, substance abuse and suicidal ideas. The patient is not nervous/anxious.    Objective:   Vitals:   10/25/20 1008 10/25/20 1045  BP: (!) 162/90 136/90  Pulse: 98   Temp: (!) 97.2 F (36.2 C)   SpO2: 96%    Body mass index is 56.26 kg/m.  Physical Examination:  Physical Exam Vitals reviewed.  Constitutional:      General: She is not in acute distress.    Appearance: She is obese.  HENT:     Right Ear: Tympanic membrane, ear canal and external ear normal.     Left Ear: Tympanic membrane, ear canal and external ear normal.  Eyes:     General: No scleral icterus.    Extraocular Movements: Extraocular movements intact.     Conjunctiva/sclera: Conjunctivae normal.  Neck:     Thyroid: No thyromegaly.  Cardiovascular:     Rate and Rhythm: Normal rate and regular rhythm.     Pulses: Normal pulses.     Heart sounds: Normal heart sounds.  Pulmonary:     Effort: Pulmonary effort is normal.     Breath sounds: Normal breath sounds.  Chest:     Chest wall: No tenderness.  Abdominal:     General: Bowel sounds are normal. There is no distension.     Palpations: Abdomen is soft.     Tenderness:  There is no abdominal tenderness.  Genitourinary:    Comments: Deferred breast and pelvic exam to GYN Musculoskeletal:        General: No tenderness. Normal range of motion.     Cervical back: Normal range of motion and neck supple.  Lymphadenopathy:     Cervical: No cervical adenopathy.  Skin:    General: Skin is warm and dry.  Neurological:     Mental Status: She  is alert and oriented to person, place, and time.  Psychiatric:        Mood and Affect: Mood normal.        Behavior: Behavior normal.        Thought Content: Thought content normal.        Judgment: Judgment normal.    ASSESSMENT and PLAN: This visit occurred during the SARS-CoV-2 public health emergency.  Safety protocols were in place, including screening questions prior to the visit, additional usage of staff PPE, and extensive cleaning of exam room while observing appropriate contact time as indicated for disinfecting solutions.   Sicilia was seen today for establish care.  Diagnoses and all orders for this visit:  Preventative health care -     Cancel: CBC with Differential/Platelet -     Cancel: Comprehensive metabolic panel -     Cologuard; Future -     CBC with Differential/Platelet -     Comprehensive metabolic panel  Vitamin D deficiency -     Cancel: Vitamin D 1,25 dihydroxy -     Vitamin D 1,25 dihydroxy  Class 3 severe obesity due to excess calories with serious comorbidity and body mass index (BMI) of 50.0 to 59.9 in adult (HCC) -     Cancel: TSH -     Cancel: Lipid panel -     Lipid panel -     TSH  Encounter for lipid screening for cardiovascular disease -     Cancel: Lipid panel -     Lipid panel  Colon cancer screening -     Cologuard; Future  Start DASH diet, small meal portions and daily exercise (brisk walking 17mns). This will help promote weight loss and prevent healthy complications like hypertension, diabetes, cancer, heart disease etc. Sign medical release to get records from  GYN. I entered cologuard order. Call office if you not get kit in 2weeks.    Problem List Items Addressed This Visit      Other   Encounter for preventative adult health care exam with abnormal findings - Primary   Vitamin D deficiency   Relevant Orders   Vitamin D 1,25 dihydroxy (Completed)    Other Visit Diagnoses    Class 3 severe obesity due to excess calories with serious comorbidity and body mass index (BMI) of 50.0 to 59.9 in adult ([facility]       Relevant Orders   Lipid panel (Completed)   TSH (Completed)   Encounter for lipid screening for cardiovascular disease       Relevant Orders   Lipid panel (Completed)   Colon cancer screening       Relevant Orders   Cologuard      Follow up: Return in about 6 months (around 04/27/2021) for BP and weight management.  CWilfred Lacy NP

## 2020-10-26 ENCOUNTER — Encounter: Payer: Self-pay | Admitting: Nurse Practitioner

## 2020-11-03 LAB — LIPID PANEL
Chol/HDL Ratio: 4.3 ratio (ref 0.0–4.4)
Cholesterol, Total: 242 mg/dL — ABNORMAL HIGH (ref 100–199)
HDL: 56 mg/dL (ref >39)
LDL Chol Calc (NIH): 150 mg/dL — ABNORMAL HIGH (ref 0–99)
Triglycerides: 202 mg/dL — ABNORMAL HIGH (ref 0–149)
VLDL Cholesterol Cal: 36 mg/dL (ref 5–40)

## 2020-11-03 LAB — COMPREHENSIVE METABOLIC PANEL
ALT: 16 IU/L (ref 0–32)
AST: 20 IU/L (ref 0–40)
Albumin/Globulin Ratio: 1.1 — ABNORMAL LOW (ref 1.2–2.2)
Albumin: 4.2 g/dL (ref 3.8–4.8)
Alkaline Phosphatase: 92 IU/L (ref 44–121)
BUN/Creatinine Ratio: 10 (ref 9–23)
BUN: 9 mg/dL (ref 6–24)
Bilirubin Total: 0.5 mg/dL (ref 0.0–1.2)
CO2: 17 mmol/L — ABNORMAL LOW (ref 20–29)
Calcium: 9.8 mg/dL (ref 8.7–10.2)
Chloride: 101 mmol/L (ref 96–106)
Creatinine, Ser: 0.88 mg/dL (ref 0.57–1.00)
Globulin, Total: 3.8 g/dL (ref 1.5–4.5)
Glucose: 97 mg/dL (ref 65–99)
Potassium: 4.3 mmol/L (ref 3.5–5.2)
Sodium: 138 mmol/L (ref 134–144)
Total Protein: 8 g/dL (ref 6.0–8.5)
eGFR: 81 mL/min/{1.73_m2} (ref >59)

## 2020-11-03 LAB — CBC WITH DIFFERENTIAL/PLATELET
Basophils Absolute: 0.1 10*3/uL (ref 0.0–0.2)
Basos: 1 %
EOS (ABSOLUTE): 0.4 10*3/uL (ref 0.0–0.4)
Eos: 4 %
Hematocrit: 41.9 % (ref 34.0–46.6)
Hemoglobin: 13.9 g/dL (ref 11.1–15.9)
Immature Grans (Abs): 0 10*3/uL (ref 0.0–0.1)
Immature Granulocytes: 0 %
Lymphocytes Absolute: 2.6 10*3/uL (ref 0.7–3.1)
Lymphs: 26 %
MCH: 28.3 pg (ref 26.6–33.0)
MCHC: 33.2 g/dL (ref 31.5–35.7)
MCV: 85 fL (ref 79–97)
Monocytes Absolute: 0.5 10*3/uL (ref 0.1–0.9)
Monocytes: 5 %
Neutrophils Absolute: 6.6 10*3/uL (ref 1.4–7.0)
Neutrophils: 64 %
Platelets: 334 10*3/uL (ref 150–450)
RBC: 4.91 x10E6/uL (ref 3.77–5.28)
RDW: 14.6 % (ref 11.7–15.4)
WBC: 10.2 10*3/uL (ref 3.4–10.8)

## 2020-11-03 LAB — TSH: TSH: 2.19 u[IU]/mL (ref 0.450–4.500)

## 2020-11-03 LAB — VITAMIN D 1,25 DIHYDROXY
Vitamin D 1, 25 (OH)2 Total: 78 pg/mL — ABNORMAL HIGH
Vitamin D2 1, 25 (OH)2: <10 pg/mL
Vitamin D3 1, 25 (OH)2: 78 pg/mL

## 2020-11-23 LAB — COLOGUARD: Cologuard: NEGATIVE

## 2020-11-27 ENCOUNTER — Encounter: Payer: Self-pay | Admitting: Nurse Practitioner

## 2021-04-27 ENCOUNTER — Ambulatory Visit: Payer: Federal, State, Local not specified - PPO | Admitting: Nurse Practitioner

## 2021-06-18 ENCOUNTER — Other Ambulatory Visit: Payer: Self-pay

## 2021-06-19 ENCOUNTER — Encounter: Payer: Self-pay | Admitting: Nurse Practitioner

## 2021-06-19 ENCOUNTER — Ambulatory Visit: Payer: Federal, State, Local not specified - PPO | Admitting: Nurse Practitioner

## 2021-06-19 DIAGNOSIS — Z6841 Body Mass Index (BMI) 40.0 and over, adult: Secondary | ICD-10-CM | POA: Diagnosis not present

## 2021-06-19 DIAGNOSIS — E781 Pure hyperglyceridemia: Secondary | ICD-10-CM | POA: Diagnosis not present

## 2021-06-19 DIAGNOSIS — E785 Hyperlipidemia, unspecified: Secondary | ICD-10-CM | POA: Insufficient documentation

## 2021-06-19 DIAGNOSIS — Z23 Encounter for immunization: Secondary | ICD-10-CM | POA: Diagnosis not present

## 2021-06-19 DIAGNOSIS — I10 Essential (primary) hypertension: Secondary | ICD-10-CM

## 2021-06-19 MED ORDER — SAXENDA 18 MG/3ML ~~LOC~~ SOPN: PEN_INJECTOR | SUBCUTANEOUS | 5 refills | Status: DC

## 2021-06-19 MED ORDER — VALSARTAN 80 MG PO TABS: 80.0000 mg | ORAL_TABLET | Freq: Every day | ORAL | 5 refills | Status: DC

## 2021-06-19 NOTE — Assessment & Plan Note (Addendum)
continuous difficulty with consistent exercise routine and healthy diet. Admits to emotional/stress eating. Advised about possible complications from obesity: diabetes, heart disease, and degenerative joint disease. We discussed use of phentermine vs saxenda injection vs wellbutrin (pros and cons)with elevated BP, I recommended use of sexanda injection. Advised about medication action and possible side effects. She verbalized undertsanding and agreed to start injection. Wt Readings from Last 3 Encounters:  06/19/21 (!) 312 lb (141.5 kg)  10/25/20 (!) 307 lb 9.6 oz (139.5 kg)  06/14/19 283 lb 12.8 oz (128.7 kg)   Check HgbA1c, TSh, CMP, and lipid panel Saxenda injection sent F/up in 45month

## 2021-06-19 NOTE — Assessment & Plan Note (Signed)
Uncontrolled, asymptomatic Repeated BP in supine position: 160/92 BP Readings from Last 3 Encounters:  06/19/21 (!) 160/86  10/25/20 136/90  06/14/19 140/82   Check CMP and TSH Start valsartan Advised about need for low sodium diet. F/up in 20month

## 2021-06-19 NOTE — Progress Notes (Signed)
Subjective:  Patient ID: Ana Johnson, female    DOB: 29-Jul-1971  Age: 50 y.o. MRN: [id number]  CC: Follow-up (6 month f/u on weight and BP. Pt states she checks BP at home and it ranges 120s-130s/70s-80s.)  HPI  BMI 50.0-59.9, adult (HCC) continuous difficulty with consistent exercise routine and healthy diet. Admits to emotional/stress eating. Advised about possible complications from obesity: diabetes, heart disease, and degenerative joint disease. We discussed use of phentermine vs saxenda injection vs wellbutrin (pros and cons)with elevated BP, I recommended use of sexanda injection. Advised about medication action and possible side effects. She verbalized undertsanding and agreed to start injection. Wt Readings from Last 3 Encounters:  06/19/21 (!) 312 lb (141.5 kg)  10/25/20 (!) 307 lb 9.6 oz (139.5 kg)  06/14/19 283 lb 12.8 oz (128.7 kg)   Check HgbA1c, TSh, CMP, and lipid panel Saxenda injection sent F/up in 15month Essential hypertension Uncontrolled, asymptomatic Repeated BP in supine position: 160/92 BP Readings from Last 3 Encounters:  06/19/21 (!) 160/86  10/25/20 136/90  06/14/19 140/82   Check CMP and TSH Start valsartan Advised about need for low sodium diet. F/up in 188monthBP Readings from Last 3 Encounters:  06/19/21 (!) 160/86  10/25/20 136/90  06/14/19 140/82    Wt Readings from Last 3 Encounters:  06/19/21 (!) 312 lb (141.5 kg)  10/25/20 (!) 307 lb 9.6 oz (139.5 kg)  06/14/19 283 lb 12.8 oz (128.7 kg)     Reviewed past Medical, Social and Family history today.  Outpatient Medications Prior to Visit  Medication Sig Dispense Refill   Ascorbic Acid (VITAMIN C) 100 MG tablet Take 100 mg by mouth daily.     cetirizine (ZYRTEC) 10 MG tablet Take 10 mg by mouth daily.     cholecalciferol (VITAMIN D3) 25 MCG (1000 UT) tablet Take 1,000 Units by mouth daily.     PARAGARD INTRAUTERINE COPPER IUD IUD ParaGard T 380A 380 square mm intrauterine  device     vitamin B-12 (CYANOCOBALAMIN) 500 MCG tablet Take 500 mcg by mouth daily.     No facility-administered medications prior to visit.    ROS See HPI  Objective:  BP (!) 160/86 (BP Location: Left Arm, Patient Position: Sitting, Cuff Size: Large)   Pulse 88   Temp 97.8 F (36.6 C) (Temporal)   Ht 5' 2"  (1.575 m)   Wt (!) 312 lb (141.5 kg)   LMP  (LMP Unknown)   SpO2 98%   BMI 57.07 kg/m   Physical Exam Constitutional:      Appearance: She is obese.  Cardiovascular:     Rate and Rhythm: Normal rate and regular rhythm.     Pulses: Normal pulses.     Heart sounds: Normal heart sounds.  Pulmonary:     Effort: Pulmonary effort is normal.     Breath sounds: Normal breath sounds.  Musculoskeletal:     Left lower leg: No edema.  Neurological:     Mental Status: She is alert and oriented to person, place, and time.  Psychiatric:        Mood and Affect: Mood normal.        Behavior: Behavior normal.        Thought Content: Thought content normal.   Assessment & Plan:  This visit occurred during the SARS-CoV-2 public health emergency.  Safety protocols were in place, including screening questions prior to the visit, additional usage of staff PPE, and extensive cleaning of exam room while observing  appropriate contact time as indicated for disinfecting solutions.   [name] was seen today for follow-up.  Diagnoses and all orders for this visit:  Class 3 severe obesity due to excess calories with serious comorbidity and body mass index (BMI) of 50.0 to 59.9 in adult ([place]) -     Cancel: Comprehensive metabolic panel -     Cancel: TSH -     Cancel: Lipid panel -     Cancel: Hemoglobin A1c -     Liraglutide -Weight Management (SAXENDA) 18 MG/3ML SOPN; 0.65m daily x 2weeks, then 1.250mdaily x 2weeks, then f/up in office -     TSH -     Lipid panel -     Hemoglobin A1c -     Comprehensive metabolic panel  Flu vaccine need -     Flu Vaccine QUAD 6+ mos PF IM (Fluarix Quad  PF)  Essential hypertension -     Cancel: Comprehensive metabolic panel -     valsartan (DIOVAN) 80 MG tablet; Take 1 tablet (80 mg total) by mouth daily. -     Comprehensive metabolic panel  Hypertriglyceridemia -     Cancel: Lipid panel -     Lipid panel   Problem List Items Addressed This Visit       Cardiovascular and Mediastinum   Essential hypertension    Uncontrolled, asymptomatic Repeated BP in supine position: 160/92 BP Readings from Last 3 Encounters:  06/19/21 (!) 160/86  10/25/20 136/90  06/14/19 140/82   Check CMP and TSH Start valsartan Advised about need for low sodium diet. F/up in 51m84month    Relevant Medications   valsartan (DIOVAN) 80 MG tablet   Other Relevant Orders   Comprehensive metabolic panel     Other   BMI 50.0-59.9, adult [place]    continuous difficulty with consistent exercise routine and healthy diet. Admits to emotional/stress eating. Advised about possible complications from obesity: diabetes, heart disease, and degenerative joint disease. We discussed use of phentermine vs saxenda injection vs wellbutrin (pros and cons)with elevated BP, I recommended use of sexanda injection. Advised about medication action and possible side effects. She verbalized undertsanding and agreed to start injection. Wt Readings from Last 3 Encounters:  06/19/21 (!) 312 lb (141.5 kg)  10/25/20 (!) 307 lb 9.6 oz (139.5 kg)  06/14/19 283 lb 12.8 oz (128.7 kg)   Check HgbA1c, TSh, CMP, and lipid panel Saxenda injection sent F/up in 547mo73month  Relevant Medications   Liraglutide -Weight Management (SAXENDA) 18 MG/3ML SOPN   Hypertriglyceridemia   Relevant Medications   valsartan (DIOVAN) 80 MG tablet   Other Relevant Orders   Lipid panel   Other Visit Diagnoses     Flu vaccine need       Relevant Orders   Flu Vaccine QUAD 6+ mos PF IM (Fluarix Quad PF) (Completed)       Follow-up: Return in about 4 weeks (around 07/17/2021) for HTN  and weight management.  CharWilfred Lacy

## 2021-06-19 NOTE — Patient Instructions (Addendum)
Start valsartan 51m daily Maintain low carb and low sodium diet.  Start saxenda injection as prescribed  Go to lab for blood draw.  Calorie Counting for Weight Loss Calories are units of energy. Your body needs a certain number of calories from food to keep going throughout the day. When you eat or drink more calories than your body needs, your body stores the extra calories mostly as fat. When you eat or drink fewer calories than your body needs, your body burns fat to get the energy it needs. Calorie counting means keeping track of how many calories you eat and drink each day. Calorie counting can be helpful if you need to lose weight. If you eat fewer calories than your body needs, you should lose weight. Ask your health care provider what a healthy weight is for you. For calorie counting to work, you will need to eat the right number of calories each day to lose a healthy amount of weight per week. A dietitian can help you figure out how many calories you need in a day and will suggest ways to reach your calorie goal. A healthy amount of weight to lose each week is usually 1-2 lb (0.5-0.9 kg). This usually means that your daily calorie intake should be reduced by 500-750 calories. Eating 1,200-1,500 calories a day can help most women lose weight. Eating 1,500-1,800 calories a day can help most men lose weight. What do I need to know about calorie counting? Work with your health care provider or dietitian to determine how many calories you should get each day. To meet your daily calorie goal, you will need to: Find out how many calories are in each food that you would like to eat. Try to do this before you eat. Decide how much of the food you plan to eat. Keep a food log. Do this by writing down what you ate and how many calories it had. To successfully lose weight, it is important to balance calorie counting with a healthy lifestyle that includes regular activity. Where do I find calorie  information? The number of calories in a food can be found on a Nutrition Facts label. If a food does not have a Nutrition Facts label, try to look up the calories online or ask your dietitian for help. Remember that calories are listed per serving. If you choose to have more than one serving of a food, you will have to multiply the calories per serving by the number of servings you plan to eat. For example, the label on a package of bread might say that a serving size is 1 slice and that there are 90 calories in a serving. If you eat 1 slice, you will have eaten 90 calories. If you eat 2 slices, you will have eaten 180 calories. How do I keep a food log? After each time that you eat, record the following in your food log as soon as possible: What you ate. Be sure to include toppings, sauces, and other extras on the food. How much you ate. This can be measured in cups, ounces, or number of items. How many calories were in each food and drink. The total number of calories in the food you ate. Keep your food log near you, such as in a pocket-sized notebook or on an app or website on your mobile phone. Some programs will calculate calories for you and show you how many calories you have left to meet your daily goal. What are some  portion-control tips? Know how many calories are in a serving. This will help you know how many servings you can have of a certain food. Use a measuring cup to measure serving sizes. You could also try weighing out portions on a kitchen scale. With time, you will be able to estimate serving sizes for some foods. Take time to put servings of different foods on your favorite plates or in your favorite bowls and cups so you know what a serving looks like. Try not to eat straight from a food's packaging, such as from a bag or box. Eating straight from the package makes it hard to see how much you are eating and can lead to overeating. Put the amount you would like to eat in a cup or  on a plate to make sure you are eating the right portion. Use smaller plates, glasses, and bowls for smaller portions and to prevent overeating. Try not to multitask. For example, avoid watching TV or using your computer while eating. If it is time to eat, sit down at a table and enjoy your food. This will help you recognize when you are full. It will also help you be more mindful of what and how much you are eating. What are tips for following this plan? Reading food labels Check the calorie count compared with the serving size. The serving size may be smaller than what you are used to eating. Check the source of the calories. Try to choose foods that are high in protein, fiber, and vitamins, and low in saturated fat, trans fat, and sodium. Shopping Read nutrition labels while you shop. This will help you make healthy decisions about which foods to buy. Pay attention to nutrition labels for low-fat or fat-free foods. These foods sometimes have the same number of calories or more calories than the full-fat versions. They also often have added sugar, starch, or salt to make up for flavor that was removed with the fat. Make a grocery list of lower-calorie foods and stick to it. Cooking Try to cook your favorite foods in a healthier way. For example, try baking instead of frying. Use low-fat dairy products. Meal planning Use more fruits and vegetables. One-half of your plate should be fruits and vegetables. Include lean proteins, such as chicken, [place], and fish. Lifestyle Each week, aim to do one of the following: 150 minutes of moderate exercise, such as walking. 75 minutes of vigorous exercise, such as running. General information Know how many calories are in the foods you eat most often. This will help you calculate calorie counts faster. Find a way of tracking calories that works for you. Get creative. Try different apps or programs if writing down calories does not work for you. What  foods should I eat?  Eat nutritious foods. It is better to have a nutritious, high-calorie food, such as an avocado, than a food with few nutrients, such as a bag of potato chips. Use your calories on foods and drinks that will fill you up and will not leave you hungry soon after eating. Examples of foods that fill you up are nuts and nut butters, vegetables, lean proteins, and high-fiber foods such as whole grains. High-fiber foods are foods with more than 5 g of fiber per serving. Pay attention to calories in drinks. Low-calorie drinks include water and unsweetened drinks. The items listed above may not be a complete list of foods and beverages you can eat. Contact a dietitian for more information. What foods should  I limit? Limit foods or drinks that are not good sources of vitamins, minerals, or protein or that are high in unhealthy fats. These include: Candy. Other sweets. Sodas, specialty coffee drinks, alcohol, and juice. The items listed above may not be a complete list of foods and beverages you should avoid. Contact a dietitian for more information. How do I count calories when eating out? Pay attention to portions. Often, portions are much larger when eating out. Try these tips to keep portions smaller: Consider sharing a meal instead of getting your own. If you get your own meal, eat only half of it. Before you start eating, ask for a container and put half of your meal into it. When available, consider ordering smaller portions from the menu instead of full portions. Pay attention to your food and drink choices. Knowing the way food is cooked and what is included with the meal can help you eat fewer calories. If calories are listed on the menu, choose the lower-calorie options. Choose dishes that include vegetables, fruits, whole grains, low-fat dairy products, and lean proteins. Choose items that are boiled, broiled, grilled, or steamed. Avoid items that are buttered, battered,  fried, or served with cream sauce. Items labeled as crispy are usually fried, unless stated otherwise. Choose water, low-fat milk, unsweetened iced tea, or other drinks without added sugar. If you want an alcoholic beverage, choose a lower-calorie option, such as a glass of wine or light beer. Ask for dressings, sauces, and syrups on the side. These are usually high in calories, so you should limit the amount you eat. If you want a salad, choose a garden salad and ask for grilled meats. Avoid extra toppings such as bacon, cheese, or fried items. Ask for the dressing on the side, or ask for olive oil and vinegar or lemon to use as dressing. Estimate how many servings of a food you are given. Knowing serving sizes will help you be aware of how much food you are eating at restaurants. Where to find more information Centers for Disease Control and Prevention: http://www.wolf.info/ U.S. Department of Agriculture: http://www.wilson-mendoza.org/ Summary Calorie counting means keeping track of how many calories you eat and drink each day. If you eat fewer calories than your body needs, you should lose weight. A healthy amount of weight to lose per week is usually 1-2 lb (0.5-0.9 kg). This usually means reducing your daily calorie intake by 500-750 calories. The number of calories in a food can be found on a Nutrition Facts label. If a food does not have a Nutrition Facts label, try to look up the calories online or ask your dietitian for help. Use smaller plates, glasses, and bowls for smaller portions and to prevent overeating. Use your calories on foods and drinks that will fill you up and not leave you hungry shortly after a meal. This information is not intended to replace advice given to you by your health care provider. Make sure you discuss any questions you have with your health care provider. Document Revised: 08/26/2019 Document Reviewed: 08/26/2019 Elsevier Patient Education  2022 [employer].

## 2021-06-20 ENCOUNTER — Encounter: Payer: Self-pay | Admitting: Nurse Practitioner

## 2021-06-20 LAB — COMPREHENSIVE METABOLIC PANEL
ALT: 11 IU/L (ref 0–32)
AST: 12 IU/L (ref 0–40)
Albumin/Globulin Ratio: 1.3 (ref 1.2–2.2)
Albumin: 4.2 g/dL (ref 3.8–4.8)
Alkaline Phosphatase: 78 IU/L (ref 44–121)
BUN/Creatinine Ratio: 15 (ref 9–23)
BUN: 12 mg/dL (ref 6–24)
Bilirubin Total: 0.4 mg/dL (ref 0.0–1.2)
CO2: 23 mmol/L (ref 20–29)
Calcium: 9.5 mg/dL (ref 8.7–10.2)
Chloride: 102 mmol/L (ref 96–106)
Creatinine, Ser: 0.79 mg/dL (ref 0.57–1.00)
Globulin, Total: 3.2 g/dL (ref 1.5–4.5)
Glucose: 92 mg/dL (ref 70–99)
Potassium: 4.9 mmol/L (ref 3.5–5.2)
Sodium: 140 mmol/L (ref 134–144)
Total Protein: 7.4 g/dL (ref 6.0–8.5)
eGFR: 92 mL/min/{1.73_m2} (ref >59)

## 2021-06-20 LAB — LIPID PANEL
Chol/HDL Ratio: 4.1 ratio (ref 0.0–4.4)
Cholesterol, Total: 197 mg/dL (ref 100–199)
HDL: 48 mg/dL (ref >39)
LDL Chol Calc (NIH): 118 mg/dL — ABNORMAL HIGH (ref 0–99)
Triglycerides: 177 mg/dL — ABNORMAL HIGH (ref 0–149)
VLDL Cholesterol Cal: 31 mg/dL (ref 5–40)

## 2021-06-20 LAB — TSH: TSH: 1.7 u[IU]/mL (ref 0.450–4.500)

## 2021-06-20 LAB — HEMOGLOBIN A1C
Est. average glucose Bld gHb Est-mCnc: 114 mg/dL
Hgb A1c MFr Bld: 5.6 % (ref 4.8–5.6)

## 2021-08-02 ENCOUNTER — Ambulatory Visit: Payer: Federal, State, Local not specified - PPO | Admitting: Nurse Practitioner

## 2021-08-06 ENCOUNTER — Other Ambulatory Visit: Payer: Self-pay

## 2021-08-06 MED ORDER — PEN NEEDLES 31G X 8 MM MISC: 1.0000 [IU] | Freq: Every day | 2 refills | Status: DC

## 2021-08-23 ENCOUNTER — Other Ambulatory Visit: Payer: Self-pay

## 2021-08-23 ENCOUNTER — Ambulatory Visit: Payer: Federal, State, Local not specified - PPO | Admitting: Nurse Practitioner

## 2021-08-23 ENCOUNTER — Encounter: Payer: Self-pay | Admitting: Nurse Practitioner

## 2021-08-23 VITALS — BP 120/78 | HR 98 | Temp 97.1°F | Resp 18 | Wt 301.0 lb

## 2021-08-23 DIAGNOSIS — I10 Essential (primary) hypertension: Secondary | ICD-10-CM | POA: Diagnosis not present

## 2021-08-23 DIAGNOSIS — E66813 Obesity, class 3: Secondary | ICD-10-CM

## 2021-08-23 DIAGNOSIS — Z6841 Body Mass Index (BMI) 40.0 and over, adult: Secondary | ICD-10-CM | POA: Diagnosis not present

## 2021-08-23 MED ORDER — SAXENDA 18 MG/3ML ~~LOC~~ SOPN: PEN_INJECTOR | SUBCUTANEOUS | 5 refills | Status: DC

## 2021-08-23 NOTE — Progress Notes (Signed)
Subjective:  Patient ID: Ana Johnson, female    DOB: 1970/08/16  Age: 51 y.o. MRN: [id number]  CC: Follow-up (Htn and weight management. )  HPI  Essential hypertension Improved BP with valsartan 384m Denies any adverse side effects BP Readings from Last 3 Encounters:  08/23/21 120/78  06/19/21 (!) 160/86  10/25/20 136/90   Maintain med dose F/up in 346month BMI 50.0-59.9, adult (H[place] 10lbs in last 3weeks. Current use of saxenda 1.84m34maily Denies any adverse side effects. Wt Readings from Last 3 Encounters:  08/23/21 (!) 301 lb (136.5 kg)  06/19/21 (!) 312 lb (141.5 kg)  10/25/20 (!) 307 lb 9.6 oz (139.5 kg)   Provided Saxenda titration instructions Encouraged to incorporate daily exercise and continue heart healthy diet F/up in   Wt Readings from Last 3 Encounters:  08/23/21 (!) 301 lb (136.5 kg)  06/19/21 (!) 312 lb (141.5 kg)  10/25/20 (!) 307 lb 9.6 oz (139.5 kg)    BP Readings from Last 3 Encounters:  08/23/21 120/78  06/19/21 (!) 160/86  10/25/20 136/90    Reviewed past Medical, Social and Family history today.  Outpatient Medications Prior to Visit  Medication Sig Dispense Refill   Ascorbic Acid (VITAMIN C) 100 MG tablet Take 100 mg by mouth daily.     cetirizine (ZYRTEC) 10 MG tablet Take 10 mg by mouth daily.     cholecalciferol (VITAMIN D3) 25 MCG (1000 UT) tablet Take 1,000 Units by mouth daily.     Insulin Pen Needle (PEN NEEDLES) 31G X 8 MM MISC 1 Units by Does not apply route daily. 100 each 2   PARAGARD INTRAUTERINE COPPER IUD IUD ParaGard T 380A 380 square mm intrauterine device     valsartan (DIOVAN) 80 MG tablet Take 1 tablet (80 mg total) by mouth daily. 30 tablet 5   vitamin B-12 (CYANOCOBALAMIN) 500 MCG tablet Take 500 mcg by mouth daily.     Liraglutide -Weight Management (SAXENDA) 18 MG/3ML SOPN 0.6mg48mily x 2weeks, then 1.84mg 49mly x 2weeks, then f/up in office 3 mL 5   No facility-administered medications prior to visit.     ROS See HPI  Objective:  BP 120/78 (BP Location: Left Arm, Patient Position: Sitting, Cuff Size: Large)    Pulse 98    Temp (!) 97.1 F (36.2 C) (Temporal)    Resp 18    Wt (!) 301 lb (136.5 kg)    SpO2 99%    BMI 55.05 kg/m   Physical Exam Constitutional:      Appearance: She is obese.  Cardiovascular:     Rate and Rhythm: Normal rate.     Pulses: Normal pulses.  Pulmonary:     Effort: Pulmonary effort is normal.  Abdominal:     Palpations: Abdomen is soft.     Tenderness: There is no abdominal tenderness.  Neurological:     Mental Status: She is alert and oriented to person, place, and time.   Assessment & Plan:  This visit occurred during the SARS-CoV-2 public health emergency.  Safety protocols were in place, including screening questions prior to the visit, additional usage of staff PPE, and extensive cleaning of exam room while observing appropriate contact time as indicated for disinfecting solutions.   FelicDeshanaseen today for follow-up.  Diagnoses and all orders for this visit:  Class 3 severe obesity due to excess calories with serious comorbidity and body mass index (BMI) of 50.0 to 59.9 in adult (HCC)[facility]  Liraglutide -Weight Management (SAXENDA) 18 MG/3ML SOPN; 1.33m daily x 2weeks, then 2.453mdaily x 2weeks, then f/up in 86m83monthMI 50.0-59.9, adult ([facility]CCedar [facility] SystemEssential hypertension    Problem List Items Addressed This Visit       Cardiovascular and Mediastinum   Essential hypertension    Improved BP with valsartan 53m60mnies any adverse side effects BP Readings from Last 3 Encounters:  08/23/21 120/78  06/19/21 (!) 160/86  10/25/20 136/90   Maintain med dose F/up in 3mon68month    Other   BMI 50.0-59.9, adult (HCC) [place]weeks. Current use of saxenda 1.2mg d67my Denies any adverse side effects. Wt Readings from Last 3 Encounters:  08/23/21 (!) 301 lb (136.5 kg)  06/19/21 (!) 312 lb (141.5 kg)  10/25/20 (!) 307 lb 9.6  oz (139.5 kg)   Provided Saxenda titration instructions Encouraged to incorporate daily exercise and continue heart healthy diet F/up in       Relevant Medications   Liraglutide -Weight Management (SAXENDA) 18 MG/3ML SOPN   Other Visit Diagnoses     Class 3 severe obesity due to excess calories with serious comorbidity and body mass index (BMI) of 50.0 to 59.9 in adult (HCC) [facility]  Primary   Relevant Medications   Liraglutide -Weight Management (SAXENDA) 18 MG/3ML SOPN       Follow-up: Return in about 4 weeks (around 09/20/2021) for HTN and weight management.  CharloWilfred Lacy

## 2021-08-23 NOTE — Assessment & Plan Note (Signed)
Improved BP with valsartan 64m Denies any adverse side effects BP Readings from Last 3 Encounters:  08/23/21 120/78  06/19/21 (!) 160/86  10/25/20 136/90   Maintain med dose F/up in 377month

## 2021-08-23 NOTE — Assessment & Plan Note (Addendum)
Lost 10lbs in last 3weeks. Current use of saxenda 1.20m daily Denies any adverse side effects. Wt Readings from Last 3 Encounters:  08/23/21 (!) 301 lb (136.5 kg)  06/19/21 (!) 312 lb (141.5 kg)  10/25/20 (!) 307 lb 9.6 oz (139.5 kg)   Provided Saxenda titration instructions Encouraged to incorporate daily exercise and continue heart healthy diet F/up in 159month

## 2021-08-23 NOTE — Patient Instructions (Addendum)
Maintain valsartan dose. Continue saxenda dose as prescribed.  Exercising to Stay Healthy To become healthy and stay healthy, it is recommended that you do moderate-intensity and vigorous-intensity exercise. You can tell that you are exercising at a moderate intensity if your heart starts beating faster and you start breathing faster but can still hold a conversation. You can tell that you are exercising at a vigorous intensity if you are breathing much harder and faster and cannot hold a conversation while exercising. How can exercise benefit me? Exercising regularly is important. It has many health benefits, such as: Improving overall fitness, flexibility, and endurance. Increasing bone density. Helping with weight control. Decreasing body fat. Increasing muscle strength and endurance. Reducing stress and tension, anxiety, depression, or anger. Improving overall health. What guidelines should I follow while exercising? Before you start a new exercise program, talk with your health care provider. Do not exercise so much that you hurt yourself, feel dizzy, or get very short of breath. Wear comfortable clothes and wear shoes with good support. Drink plenty of water while you exercise to prevent dehydration or heat stroke. Work out until your breathing and your heartbeat get faster (moderate intensity). How often should I exercise? Choose an activity that you enjoy, and set realistic goals. Your health care provider can help you make an activity plan that is individually designed and works best for you. Exercise regularly as told by your health care provider. This may include: Doing strength training two times a week, such as: Lifting weights. Using resistance bands. Push-ups. Sit-ups. Yoga. Doing a certain intensity of exercise for a given amount of time. Choose from these options: A total of 150 minutes of moderate-intensity exercise every week. A total of 75 minutes of  vigorous-intensity exercise every week. A mix of moderate-intensity and vigorous-intensity exercise every week. Children, pregnant women, people who have not exercised regularly, people who are overweight, and older adults may need to talk with a health care provider about what activities are safe to perform. If you have a medical condition, be sure to talk with your health care provider before you start a new exercise program. What are some exercise ideas? Moderate-intensity exercise ideas include: Walking 1 mile (1.6 km) in about 15 minutes. Biking. Hiking. Golfing. Dancing. Water aerobics. Vigorous-intensity exercise ideas include: Walking 4.5 miles (7.2 km) or more in about 1 hour. Jogging or running 5 miles (8 km) in about 1 hour. Biking 10 miles (16.1 km) or more in about 1 hour. Lap swimming. Roller-skating or in-line skating. Cross-country skiing. Vigorous competitive sports, such as football, basketball, and soccer. Jumping rope. Aerobic dancing. What are some everyday activities that can help me get exercise? Yard work, such as: Psychologist, educational. Raking and bagging leaves. Washing your car. Pushing a stroller. Shoveling snow. Gardening. Washing windows or floors. How can I be more active in my day-to-day activities? Use stairs instead of an elevator. Take a walk during your lunch break. If you drive, park your car farther away from your work or school. If you take public transportation, get off one stop early and walk the rest of the way. Stand up or walk around during all of your indoor phone calls. Get up, stretch, and walk around every 30 minutes throughout the day. Enjoy exercise with a friend. Support to continue exercising will help you keep a regular routine of activity. Where to find more information You can find more information about exercising to stay healthy from: U.S. Department of Health and  Human Services: [employer].at Centers for Disease Control  and Prevention (CDC): http://www.wolf.info/ Summary Exercising regularly is important. It will improve your overall fitness, flexibility, and endurance. Regular exercise will also improve your overall health. It can help you control your weight, reduce stress, and improve your bone density. Do not exercise so much that you hurt yourself, feel dizzy, or get very short of breath. Before you start a new exercise program, talk with your health care provider. This information is not intended to replace advice given to you by your health care provider. Make sure you discuss any questions you have with your health care provider. Document Revised: 11/10/2020 Document Reviewed: 11/10/2020 Elsevier Patient Education  [place].

## 2021-08-28 ENCOUNTER — Other Ambulatory Visit: Payer: Self-pay

## 2021-08-28 DIAGNOSIS — Z6841 Body Mass Index (BMI) 40.0 and over, adult: Secondary | ICD-10-CM

## 2021-08-28 MED ORDER — SAXENDA 18 MG/3ML ~~LOC~~ SOPN: PEN_INJECTOR | SUBCUTANEOUS | 5 refills | Status: DC

## 2021-08-28 NOTE — Telephone Encounter (Signed)
Pt states Rx was sent to wrong pharmacy and she would like it sent to [facility]. Pt states she has not picked up Rx from [facility] and I called to cancel and [facility] state the never filled Rx because patient never came. Sending Rx to correct pharmacy and updating patient chart.

## 2021-09-09 ENCOUNTER — Other Ambulatory Visit: Payer: Self-pay | Admitting: Nurse Practitioner

## 2021-09-09 MED ORDER — SAXENDA 18 MG/3ML ~~LOC~~ SOPN: PEN_INJECTOR | SUBCUTANEOUS | 0 refills | Status: DC

## 2021-10-02 ENCOUNTER — Ambulatory Visit: Payer: Federal, State, Local not specified - PPO | Admitting: Nurse Practitioner

## 2021-10-02 ENCOUNTER — Encounter: Payer: Self-pay | Admitting: Nurse Practitioner

## 2021-10-02 ENCOUNTER — Other Ambulatory Visit: Payer: Self-pay

## 2021-10-02 VITALS — BP 134/72 | HR 100 | Temp 97.2°F | Ht 62.0 in | Wt 294.2 lb

## 2021-10-02 DIAGNOSIS — Z6841 Body Mass Index (BMI) 40.0 and over, adult: Secondary | ICD-10-CM | POA: Diagnosis not present

## 2021-10-02 DIAGNOSIS — E781 Pure hyperglyceridemia: Secondary | ICD-10-CM | POA: Diagnosis not present

## 2021-10-02 DIAGNOSIS — I10 Essential (primary) hypertension: Secondary | ICD-10-CM | POA: Diagnosis not present

## 2021-10-02 MED ORDER — SAXENDA 18 MG/3ML ~~LOC~~ SOPN: 3.0000 mg | PEN_INJECTOR | Freq: Every day | SUBCUTANEOUS | 1 refills | Status: DC

## 2021-10-02 MED ORDER — VALSARTAN 80 MG PO TABS: 80.0000 mg | ORAL_TABLET | Freq: Every day | ORAL | 3 refills | Status: DC

## 2021-10-02 NOTE — Assessment & Plan Note (Addendum)
Lost 7lbs in last 4weeks ?Total weight loss: 17lbs in last 15month. ?Diet: high protein and high fiber ?Exercise: home videos and weight training, 3x/week, 30-421ms each. ? ?Current dose at 84m31maily, Reports constipation: previous BM pattern 3x/day (formed), current bowel pattern 1x/day (formed to pellets, no bloating, no nausea) ?Advised to use miralax EOD or metamucil 1tsp 1-2x/day, maintain adequate oral hydration. ?Maintain saxenda dose ?Continue switching to wegovy if covered by insurance ?F/up in 51mo26month

## 2021-10-02 NOTE — Assessment & Plan Note (Addendum)
Home BP 130s-107s/70s-80s ?Current use of losartan, no adverse side effects ?BP Readings from Last 3 Encounters:  ?10/02/21 134/72  ?08/23/21 120/78  ?06/19/21 (!) 160/86  ? ?Maintain med dose ?Refill sent ?

## 2021-10-02 NOTE — Progress Notes (Signed)
? ?Subjective:  ?Patient ID: Ana Johnson, female    DOB: 07/08/1971  Age: 51 y.o. MRN: [id number] ? ?CC: Follow-up (1 month f/u on weight and HTN. ) ? ?HPI ? ?BMI 50.0-59.9, adult ([place]) ?Lost 7lbs in last 4weeks ?Total weight loss: 17lbs in last 1108month. ?Diet: high protein and high fiber ?Exercise: home videos and weight training, 3x/week, 30-476ms each. ? ?Current dose at 108m36maily, Reports constipation: previous BM pattern 3x/day (formed), current bowel pattern 1x/day (formed to pellets, no bloating, no nausea) ?Advised to use miralax EOD or metamucil 1tsp 1-2x/day, maintain adequate oral hydration. ?Maintain saxenda dose ?Continue switching to wegovy if covered by insurance ?F/up in 55mo7108monthEssential hypertension ?Home BP 130s-107s/70s-80s ?Current use of losartan, no adverse side effects ?BP Readings from Last 3 Encounters:  ?10/02/21 134/72  ?08/23/21 120/78  ?06/19/21 (!) 160/86  ? ?Maintain med dose ?Refill sent ? ?Hypertriglyceridemia ?Repeat lipid panel ? ?Wt Readings from Last 3 Encounters:  ?10/02/21 294 lb 3.2 oz (133.4 kg)  ?08/23/21 (!) 301 lb (136.5 kg)  ?06/19/21 (!) 312 lb (141.5 kg)  ?  ?Reviewed past Medical, Social and Family history today. ? ?Outpatient Medications Prior to Visit  ?Medication Sig Dispense Refill  ? Ascorbic Acid (VITAMIN C) 100 MG tablet Take 100 mg by mouth daily.    ? cetirizine (ZYRTEC) 10 MG tablet Take 10 mg by mouth daily.    ? cholecalciferol (VITAMIN D3) 25 MCG (1000 UT) tablet Take 1,000 Units by mouth daily.    ? Insulin Pen Needle (PEN NEEDLES) 31G X 8 MM MISC 1 Units by Does not apply route daily. 100 each 2  ? PARAGARD INTRAUTERINE COPPER IUD IUD ParaGard T 380A 380 square mm intrauterine device    ? vitamin B-12 (CYANOCOBALAMIN) 500 MCG tablet Take 500 mcg by mouth daily.    ? Liraglutide -Weight Management (SAXENDA) 18 MG/3ML SOPN 1.8mg 6108mly x 2weeks, then 2.4mg d40my x 2weeks, then f/up in 55month36month0  ? valsartan (DIOVAN) 80 MG tablet Take 1 tablet  (80 mg total) by mouth daily. 30 tablet 5  ? ?No facility-administered medications prior to visit.  ? ? ?ROS ?See HPI ? ?Objective:  ?BP 134/72 (BP Location: Left Arm, Patient Position: Sitting, Cuff Size: Large)   Pulse 100   Temp (!) 97.2 ?F (36.2 ?C) (Temporal)   Ht 5' 2"  (1.575 m)   Wt 294 lb 3.2 oz (133.4 kg)   SpO2 96%   BMI 53.81 kg/m?  ? ?Physical Exam ?Constitutional:   ?   Appearance: She is obese.  ?Cardiovascular:  ?   Rate and Rhythm: Normal rate.  ?   Pulses: Normal pulses.  ?Pulmonary:  ?   Effort: Pulmonary effort is normal.  ?Neurological:  ?   Mental Status: She is alert and oriented to person, place, and time.  ?Psychiatric:     ?   Mood and Affect: Mood normal.     ?   Behavior: Behavior normal.     ?   Thought Content: Thought content normal.  ? ?Assessment & Plan:  ?This visit occurred during the SARS-CoV-2 public health emergency.  Safety protocols were in place, including screening questions prior to the visit, additional usage of staff PPE, and extensive cleaning of exam room while observing appropriate contact time as indicated for disinfecting solutions.  ? ?FeliciaJamesettaen today for follow-up. ? ?Diagnoses and all orders for this visit: ? ?Class 3 severe obesity due to excess calories with serious  comorbidity and body mass index (BMI) of 50.0 to 59.9 in adult [facility]) ?-     Liraglutide -Weight Management (SAXENDA) 18 MG/3ML SOPN; Inject 3 mg into the skin daily. ? ?Essential hypertension ?-     valsartan (DIOVAN) 80 MG tablet; Take 1 tablet (80 mg total) by mouth daily. ? ?Hypertriglyceridemia ?-     Cancel: Lipid panel ?-     Lipid panel ? ? ?Problem List Items Addressed This Visit   ? ?  ? Cardiovascular and Mediastinum  ? Essential hypertension  ?  Home BP 130s-107s/70s-80s ?Current use of losartan, no adverse side effects ?BP Readings from Last 3 Encounters:  ?10/02/21 134/72  ?08/23/21 120/78  ?06/19/21 (!) 160/86  ? ?Maintain med dose ?Refill sent ?  ?  ? Relevant Medications   ? valsartan (DIOVAN) 80 MG tablet  ?  ? Other  ? BMI 50.0-59.9, adult [facility]) - Primary  ?  Lost 7lbs in last 4weeks ?Total weight loss: 17lbs in last 89month. ?Diet: high protein and high fiber ?Exercise: home videos and weight training, 3x/week, 30-470ms each. ? ?Current dose at 9m1maily, Reports constipation: previous BM pattern 3x/day (formed), current bowel pattern 1x/day (formed to pellets, no bloating, no nausea) ?Advised to use miralax EOD or metamucil 1tsp 1-2x/day, maintain adequate oral hydration. ?Maintain saxenda dose ?Continue switching to wegovy if covered by insurance ?F/up in 24mo69month?  ? Relevant Medications  ? Liraglutide -Weight Management (SAXENDA) 18 MG/3ML SOPN  ? Hypertriglyceridemia  ?  Repeat lipid panel ?  ?  ? Relevant Medications  ? valsartan (DIOVAN) 80 MG tablet  ? Other Relevant Orders  ? Lipid panel  ?  ?Follow-up: Return in about 4 weeks (around 10/30/2021) for weight management. ? ?CharWilfred Lacy ?

## 2021-10-02 NOTE — Patient Instructions (Signed)
For Constipation: use miralax EOD or metamucil 1tsp 1-2x/day, maintain adequate oral hydration. ?Maintain saxenda dose at 37m daily ?Let me know if WMancel Parsonsis covered. ? ?Go to lab for blood draw ?

## 2021-10-02 NOTE — Assessment & Plan Note (Signed)
Repeat lipid panel ?

## 2021-10-03 LAB — LIPID PANEL
Chol/HDL Ratio: 4.9 ratio — ABNORMAL HIGH (ref 0.0–4.4)
Cholesterol, Total: 229 mg/dL — ABNORMAL HIGH (ref 100–199)
HDL: 47 mg/dL (ref >39)
LDL Chol Calc (NIH): 152 mg/dL — ABNORMAL HIGH (ref 0–99)
Triglycerides: 166 mg/dL — ABNORMAL HIGH (ref 0–149)
VLDL Cholesterol Cal: 30 mg/dL (ref 5–40)

## 2021-10-03 MED ORDER — SEMAGLUTIDE-WEIGHT MANAGEMENT 1.7 MG/0.75ML ~~LOC~~ SOAJ: 1.7000 mg | SUBCUTANEOUS | 2 refills | Status: DC

## 2021-10-13 ENCOUNTER — Other Ambulatory Visit: Payer: Self-pay | Admitting: Nurse Practitioner

## 2021-10-13 DIAGNOSIS — Z6841 Body Mass Index (BMI) 40.0 and over, adult: Secondary | ICD-10-CM

## 2021-10-15 ENCOUNTER — Other Ambulatory Visit: Payer: Self-pay | Admitting: Nurse Practitioner

## 2021-10-15 DIAGNOSIS — Z6841 Body Mass Index (BMI) 40.0 and over, adult: Secondary | ICD-10-CM

## 2021-10-15 MED ORDER — WEGOVY 1 MG/0.5ML ~~LOC~~ SOAJ: 1.0000 mg | SUBCUTANEOUS | 0 refills | Status: DC

## 2021-11-05 ENCOUNTER — Ambulatory Visit: Payer: Federal, State, Local not specified - PPO | Admitting: Nurse Practitioner

## 2021-11-06 ENCOUNTER — Other Ambulatory Visit: Payer: Self-pay | Admitting: Nurse Practitioner

## 2021-11-06 DIAGNOSIS — Z6841 Body Mass Index (BMI) 40.0 and over, adult: Secondary | ICD-10-CM

## 2021-11-22 ENCOUNTER — Ambulatory Visit: Payer: Federal, State, Local not specified - PPO | Admitting: Nurse Practitioner

## 2021-12-04 ENCOUNTER — Ambulatory Visit: Payer: Federal, State, Local not specified - PPO | Admitting: Nurse Practitioner

## 2021-12-04 ENCOUNTER — Encounter: Payer: Self-pay | Admitting: Nurse Practitioner

## 2021-12-04 VITALS — BP 136/82 | HR 94 | Temp 97.6°F | Wt 288.8 lb

## 2021-12-04 DIAGNOSIS — Z6841 Body Mass Index (BMI) 40.0 and over, adult: Secondary | ICD-10-CM | POA: Diagnosis not present

## 2021-12-04 DIAGNOSIS — N3000 Acute cystitis without hematuria: Secondary | ICD-10-CM | POA: Diagnosis not present

## 2021-12-04 LAB — POCT URINALYSIS DIPSTICK
Bilirubin, UA: NEGATIVE
Blood, UA: NEGATIVE
Glucose, UA: NEGATIVE
Ketones, UA: NEGATIVE
Nitrite, UA: NEGATIVE
Protein, UA: POSITIVE — AB
Spec Grav, UA: 1.025 (ref 1.010–1.025)
Urobilinogen, UA: NEGATIVE E.U./dL — AB
pH, UA: 6 (ref 5.0–8.0)

## 2021-12-04 MED ORDER — WEGOVY 1.7 MG/0.75ML ~~LOC~~ SOAJ: 1.7000 mg | SUBCUTANEOUS | 0 refills | Status: DC

## 2021-12-04 MED ORDER — WEGOVY 2.4 MG/0.75ML ~~LOC~~ SOAJ: 2.4000 mg | SUBCUTANEOUS | 2 refills | Status: DC

## 2021-12-04 MED ORDER — SULFAMETHOXAZOLE-TRIMETHOPRIM 800-160 MG PO TABS: 1.0000 | ORAL_TABLET | Freq: Two times a day (BID) | ORAL | 0 refills | Status: DC

## 2021-12-04 NOTE — Assessment & Plan Note (Signed)
Lost 6lbs in last 3month ?Total weight loss with GLP-1: 23lbs ?Denies any adverse effects with wegovy 14m? ?Change wegovy to 1.42m2meekly x 2weeks, then 2.4mg55mekly continuously. ?Continue heart healthy diet and daily exercise ?F/up in 3mon64month

## 2021-12-04 NOTE — Patient Instructions (Addendum)
Change wegovy to 1.75m weekly x 2weeks, then 2.427mweekly continuously ? ?Start bactrim for UTI while waiting for urine culture. ?Maintain adequate oral hydration. ? ?Preventing High Cholesterol ?Cholesterol is a white, waxy substance similar to fat that the human body needs to help build cells. The liver makes all the cholesterol that a person's body needs. Having high cholesterol (hypercholesterolemia) increases your risk for heart disease and stroke. Extra or excess cholesterol comes from the food that you eat. ?High cholesterol can often be prevented with diet and lifestyle changes. If you already have high cholesterol, you can control it with diet, lifestyle changes, and medicines. ?How can high cholesterol affect me? ?If you have high cholesterol, fatty deposits (plaques) may build up on the walls of your blood vessels. The blood vessels that carry blood away from your heart are called arteries. Plaques make the arteries narrower and stiffer. This in turn can: ?Restrict or block blood flow and cause blood clots to form. ?Increase your risk for heart attack and stroke. ?What can increase my risk for high cholesterol? ?This condition is more likely to develop in people who: ?Eat foods that are high in saturated fat or cholesterol. Saturated fat is mostly found in foods that come from animal sources. ?Are overweight. ?Are not getting enough exercise. ?Use products that contain nicotine or tobacco, such as cigarettes, e-cigarettes, and chewing tobacco. ?Have a family history of high cholesterol (familial hypercholesterolemia). ?What actions can I take to prevent this? ?Nutrition ? ?Eat less saturated fat. ?Avoid trans fats (partially hydrogenated oils). These are often found in margarine and in some baked goods, fried foods, and snacks bought in packages. ?Avoid precooked or cured meat, such as bacon, sausages, or meat loaves. ?Avoid foods and drinks that have added sugars. ?Eat more fruits, vegetables, and whole  grains. ?Choose healthy sources of protein, such as fish, poultry, lean cuts of red meat, beans, peas, lentils, and nuts. ?Choose healthy sources of fat, such as: ?Nuts. ?Vegetable oils, especially olive oil. ?Fish that have healthy fats, such as omega-3 fatty acids. These fish include mackerel or salmon. ?Lifestyle ?Lose weight if you are overweight. Maintaining a healthy body mass index (BMI) can help prevent or control high cholesterol. It can also lower your risk for diabetes and high blood pressure. Ask your health care provider to help you with a diet and exercise plan to lose weight safely. ?Do not use any products that contain nicotine or tobacco. These products include cigarettes, chewing tobacco, and vaping devices, such as e-cigarettes. If you need help quitting, ask your health care provider. ?Alcohol use ?Do not drink alcohol if: ?Your health care provider tells you not to drink. ?You are pregnant, may be pregnant, or are planning to become pregnant. ?If you drink alcohol: ?Limit how much you have to: ?0-1 drink a day for women. ?0-2 drinks a day for men. ?Know how much alcohol is in your drink. In the [place] one drink equals one 12 oz bottle of beer (355 mL), one 5 oz glass of wine (148 mL), or one 1? oz glass of hard liquor (44 mL). ?Activity ? ?Get enough exercise. Do exercises as told by your health care provider. ?Each week, do at least 150 minutes of exercise that takes a medium level of effort (moderate-intensity exercise). This kind of exercise: ?Makes your heart beat faster while allowing you to still be able to talk. ?Can be done in short sessions several times a day or longer sessions a few times a week.  For example, on 5 days each week, you could walk fast or ride your bike 3 times a day for 10 minutes each time. ?Medicines ?Your health care provider may recommend medicines to help lower cholesterol. This may be a medicine to lower the amount of cholesterol that your liver makes. You may need  medicine if: ?Diet and lifestyle changes have not lowered your cholesterol enough. ?You have high cholesterol and other risk factors for heart disease or stroke. ?Take over-the-counter and prescription medicines only as told by your health care provider. ?General information ?Manage your risk factors for high cholesterol. Talk with your health care provider about all your risk factors and how to lower your risk. ?Manage other conditions that you have, such as diabetes or high blood pressure (hypertension). ?Have blood tests to check your cholesterol levels at regular points in time as told by your health care provider. ?Keep all follow-up visits. This is important. ?Where to find more information ?American Heart Association: www.heart.org ?National Heart, Lung, and Blood Institute: https://wilson-e[place].com/ ?Summary ?High cholesterol increases your risk for heart disease and stroke. By keeping your cholesterol level low, you can reduce your risk for these conditions. ?High cholesterol can often be prevented with diet and lifestyle changes. ?Work with your health care provider to manage your risk factors, and have your blood tested regularly. ?This information is not intended to replace advice given to you by your health care provider. Make sure you discuss any questions you have with your health care provider. ?Document Revised: 09/18/2020 Document Reviewed: 09/18/2020 ?Elsevier Patient Education ? [place]. ? ?

## 2021-12-04 NOTE — Progress Notes (Signed)
? ?             Established Patient Visit ? ?Patient: Ana Johnson   DOB: 25-Mar-1971   51 y.o. Female  MRN: [id number] ?Visit Date: 12/04/2021 ? ?Subjective:  ?  ?Chief Complaint  ?Patient presents with  ? Weight management  ?  4 week follow up on wegovy and weight management.  ?Patient states that she feels pressure and frequent urination x 1 week.   ? ?Urinary Tract Infection  ?This is a new problem. The current episode started in the past 7 days. The problem occurs every urination. The problem has been unchanged. The quality of the pain is described as burning. There has been no fever. She is Sexually active. There is No history of pyelonephritis. Associated symptoms include frequency and urgency. Pertinent negatives include no chills, discharge, flank pain, hematuria, hesitancy, nausea, possible pregnancy, sweats or vomiting. She has tried increased fluids for the symptoms. There is no history of recurrent UTIs or urinary stasis.  ?BMI 50.0-59.9, adult ([place]) ?Lost 6lbs in last 59month ?Total weight loss with GLP-1: 23lbs ?Denies any adverse effects with wegovy 188m? ?Change wegovy to 1.38m35meekly x 2weeks, then 2.4mg64mekly continuously. ?Continue heart healthy diet and daily exercise ?F/up in 3mon58monthWt Readings from Last 3 Encounters:  ?12/04/21 288 lb 12.8 oz (131 kg)  ?10/02/21 294 lb 3.2 oz (133.4 kg)  ?08/23/21 (!) 301 lb (136.5 kg)  ?  ?Reviewed medical, surgical, and social history today ? ?Medications: ?Outpatient Medications Prior to Visit  ?Medication Sig  ? Ascorbic Acid (VITAMIN C) 100 MG tablet Take 100 mg by mouth daily.  ? cetirizine (ZYRTEC) 10 MG tablet Take 10 mg by mouth daily.  ? cholecalciferol (VITAMIN D3) 25 MCG (1000 UT) tablet Take 1,000 Units by mouth daily.  ? Insulin Pen Needle (PEN NEEDLES) 31G X 8 MM MISC 1 Units by Does not apply route daily.  ? PARAGARD INTRAUTERINE COPPER IUD IUD ParaGard T 380A 380 square mm intrauterine device  ? valsartan (DIOVAN) 80 MG tablet Take 1  tablet (80 mg total) by mouth daily.  ? vitamin B-12 (CYANOCOBALAMIN) 500 MCG tablet Take 500 mcg by mouth daily.  ? [DISCONTINUED] Semaglutide-Weight Management (WEGOVY) 1 MG/0.5ML SOAJ INJECT 1MG INTO THE SKIN ONCE A WEEK  ? ?No facility-administered medications prior to visit.  ? ?Reviewed past medical and social history.  ? ?ROS per HPI above ? ? ?   ?Objective:  ?BP 136/82 (BP Location: Left Arm, Patient Position: Sitting, Cuff Size: Large)   Pulse 94   Temp 97.6 ?F (36.4 ?C) (Temporal)   Wt 288 lb 12.8 oz (131 kg)   SpO2 98%   BMI 52.82 kg/m?  ? ?  ? ?Physical Exam ?Vitals reviewed.  ?Cardiovascular:  ?   Rate and Rhythm: Normal rate.  ?   Pulses: Normal pulses.  ?Pulmonary:  ?   Effort: Pulmonary effort is normal.  ?Neurological:  ?   Mental Status: She is alert and oriented to person, place, and time.  ?Psychiatric:     ?   Mood and Affect: Mood normal.     ?   Behavior: Behavior normal.     ?   Thought Content: Thought content normal.  ?  ?Results for orders placed or performed in visit on 12/04/21  ?POCT Urinalysis Dipstick  ?Result Value Ref Range  ? Color, UA Dark yellow   ? Clarity, UA Cloudy   ? Glucose, UA Negative Negative  ?  Bilirubin, UA Negative   ? Ketones, UA Negative   ? Spec Grav, UA 1.025 1.010 - 1.025  ? Blood, UA Negative   ? pH, UA 6.0 5.0 - 8.0  ? Protein, UA Positive (A) Negative  ? Urobilinogen, UA negative (A) 0.2 or 1.0 E.U./dL  ? Nitrite, UA Negative   ? Leukocytes, UA Small (1+) (A) Negative  ? Appearance    ? Odor    ? ?   ?Assessment & Plan:  ?  ?Problem List Items Addressed This Visit   ? ?  ? Other  ? BMI 50.0-59.9, adult ([place])  ?  Lost 6lbs in last 64month ?Total weight loss with GLP-1: 23lbs ?Denies any adverse effects with wegovy 133m? ?Change wegovy to 1.33m81meekly x 2weeks, then 2.4mg50mekly continuously. ?Continue heart healthy diet and daily exercise ?F/up in 3mon233month?  ? Relevant Medications  ? Semaglutide-Weight Management (WEGOVY) 1.7 MG/0.75ML SOAJ  ?  Semaglutide-Weight Management (WEGOVY) 2.4 MG/0.75ML SOAJ (Start on 12/25/2021)  ? ?Other Visit Diagnoses   ? ? Acute cystitis without hematuria    -  Primary  ? Relevant Medications  ? sulfamethoxazole-trimethoprim (BACTRIM DS) 800-160 MG tablet  ? Other Relevant Orders  ? POCT Urinalysis Dipstick (Completed)  ? Urine Culture  ? Morbid obesity (HCC) [place]  ? Relevant Medications  ? Semaglutide-Weight Management (WEGOVY) 1.7 MG/0.75ML SOAJ  ? Semaglutide-Weight Management (WEGOVY) 2.4 MG/0.75ML SOAJ (Start on 12/25/2021)  ? ?  ? ?Return in about 3 months (around 03/06/2022) for hyperlipidemia and weight management (fasting). ? ?  ? ?CharlWilfred Lacy? ? ?

## 2021-12-05 LAB — URINE CULTURE
MICRO NUMBER:: 13371811
SPECIMEN QUALITY:: ADEQUATE

## 2022-03-06 ENCOUNTER — Ambulatory Visit: Payer: Federal, State, Local not specified - PPO | Admitting: Nurse Practitioner

## 2022-03-19 ENCOUNTER — Other Ambulatory Visit: Payer: Self-pay | Admitting: Nurse Practitioner

## 2022-03-19 DIAGNOSIS — Z6841 Body Mass Index (BMI) 40.0 and over, adult: Secondary | ICD-10-CM

## 2022-03-22 ENCOUNTER — Encounter: Payer: Self-pay | Admitting: Nurse Practitioner

## 2022-03-22 ENCOUNTER — Ambulatory Visit (INDEPENDENT_AMBULATORY_CARE_PROVIDER_SITE_OTHER): Payer: Federal, State, Local not specified - PPO | Admitting: Nurse Practitioner

## 2022-03-22 VITALS — BP 126/76 | HR 97 | Temp 97.0°F | Ht 62.0 in | Wt 277.8 lb

## 2022-03-22 DIAGNOSIS — Z6841 Body Mass Index (BMI) 40.0 and over, adult: Secondary | ICD-10-CM | POA: Diagnosis not present

## 2022-03-22 DIAGNOSIS — E781 Pure hyperglyceridemia: Secondary | ICD-10-CM

## 2022-03-22 MED ORDER — WEGOVY 2.4 MG/0.75ML ~~LOC~~ SOAJ: 2.4000 mg | SUBCUTANEOUS | 2 refills | Status: DC

## 2022-03-22 NOTE — Progress Notes (Signed)
Established Patient Visit  Patient: Ana Johnson   DOB: Apr 06, 1971   51 y.o. Female  MRN: [id number] Visit Date: 03/22/2022  Subjective:    Chief Complaint  Patient presents with   Office Visit    Hyperlipidemia & weight management f/u No concerns    HPI Morbid obesity (HCC) 10lbs weight loss in last 79months Total weight loss in last 58months: 35lbs Denies any adverse effects with wegovy Walking 3x/week, 30-46mins Diet: decrease portion, low carb, low fat, high protein Wt Readings from Last 3 Encounters:  03/22/22 277 lb 12.8 oz (126 kg)  12/04/21 288 lb 12.8 oz (131 kg)  10/02/21 294 lb 3.2 oz (133.4 kg)   Maintain med dose Check cmp and hgbA1c  Hypertriglyceridemia Repeat lipid panel  BP Readings from Last 3 Encounters:  03/22/22 126/76  12/04/21 136/82  10/02/21 134/72     Reviewed medical, surgical, and social history today  Medications: Outpatient Medications Prior to Visit  Medication Sig   Ascorbic Acid (VITAMIN C) 100 MG tablet Take 100 mg by mouth daily.   cetirizine (ZYRTEC) 10 MG tablet Take 10 mg by mouth daily.   cholecalciferol (VITAMIN D3) 25 MCG (1000 UT) tablet Take 1,000 Units by mouth daily.   Insulin Pen Needle (PEN NEEDLES) 31G X 8 MM MISC 1 Units by Does not apply route daily.   PARAGARD INTRAUTERINE COPPER IUD IUD ParaGard T 380A 380 square mm intrauterine device   valsartan (DIOVAN) 80 MG tablet Take 1 tablet (80 mg total) by mouth daily.   vitamin B-12 (CYANOCOBALAMIN) 500 MCG tablet Take 500 mcg by mouth daily.   [DISCONTINUED] Semaglutide-Weight Management (WEGOVY) 2.4 MG/0.75ML SOAJ Inject 2.4 mg into the skin once a week.   [DISCONTINUED] Semaglutide-Weight Management (WEGOVY) 1.7 MG/0.75ML SOAJ Inject 1.7 mg into the skin once a week. (Patient not taking: Reported on 03/22/2022)   [DISCONTINUED] sulfamethoxazole-trimethoprim (BACTRIM DS) 800-160 MG tablet Take 1 tablet by mouth 2 (two) times daily. (Patient not taking:  Reported on 03/22/2022)   No facility-administered medications prior to visit.   Reviewed past medical and social history.   ROS per HPI above      Objective:  BP 126/76 (BP Location: Right Arm, Patient Position: Sitting, Cuff Size: Normal)   Pulse 97   Temp (!) 97 F (36.1 C) (Temporal)   Ht 5\' 2"  (1.575 m)   Wt 277 lb 12.8 oz (126 kg)   SpO2 97%   BMI 50.81 kg/m      Physical Exam Vitals reviewed.  Neck:     Thyroid: No thyroid mass, thyromegaly or thyroid tenderness.  Cardiovascular:     Rate and Rhythm: Normal rate.     Pulses: Normal pulses.  Pulmonary:     Effort: Pulmonary effort is normal.  Abdominal:     General: Bowel sounds are normal. There is no distension.     Palpations: Abdomen is soft.     Tenderness: There is no abdominal tenderness. There is no guarding.  Musculoskeletal:     Cervical back: Normal range of motion and neck supple.  Lymphadenopathy:     Cervical: No cervical adenopathy.  Neurological:     Mental Status: She is alert and oriented to person, place, and time.  Psychiatric:        Mood and Affect: Mood normal.        Behavior: Behavior normal.        Thought  Content: Thought content normal.     No results found for any visits on 03/22/22.    Assessment & Plan:    Problem List Items Addressed This Visit       Other   Hypertriglyceridemia - Primary    Repeat lipid panel      Relevant Orders   Lipid panel   Morbid obesity (HCC)    10lbs weight loss in last 29months Total weight loss in last 66months: 35lbs Denies any adverse effects with wegovy Walking 3x/week, 30-72mins Diet: decrease portion, low carb, low fat, high protein Wt Readings from Last 3 Encounters:  03/22/22 277 lb 12.8 oz (126 kg)  12/04/21 288 lb 12.8 oz (131 kg)  10/02/21 294 lb 3.2 oz (133.4 kg)   Maintain med dose Check cmp and hgbA1c      Relevant Medications   Semaglutide-Weight Management (WEGOVY) 2.4 MG/0.75ML SOAJ   Other Relevant Orders    Comprehensive metabolic panel   Hemoglobin A1c   Other Visit Diagnoses     BMI 50.0-59.9, adult (HCC)       Relevant Medications   Semaglutide-Weight Management (WEGOVY) 2.4 MG/0.75ML SOAJ      Return in about 3 months (around 06/22/2022) for CPE (fasting).     Alysia Penna, NP

## 2022-03-22 NOTE — Assessment & Plan Note (Addendum)
10lbs weight loss in last 68months Total weight loss in last 69months: 35lbs Denies any adverse effects with wegovy Walking 3x/week, 30-47mins Diet: decrease portion, low carb, low fat, high protein Wt Readings from Last 3 Encounters:  03/22/22 277 lb 12.8 oz (126 kg)  12/04/21 288 lb 12.8 oz (131 kg)  10/02/21 294 lb 3.2 oz (133.4 kg)   Maintain med dose Check cmp and hgbA1c

## 2022-03-22 NOTE — Assessment & Plan Note (Signed)
Repeat lipid panel ?

## 2022-03-22 NOTE — Patient Instructions (Signed)
Maintain med dose Go to lab 

## 2022-03-23 LAB — COMPREHENSIVE METABOLIC PANEL
ALT: 10 IU/L (ref 0–32)
AST: 10 IU/L (ref 0–40)
Albumin/Globulin Ratio: 1.5 (ref 1.2–2.2)
Albumin: 4.4 g/dL (ref 3.9–4.9)
Alkaline Phosphatase: 79 IU/L (ref 44–121)
BUN/Creatinine Ratio: 11 (ref 9–23)
BUN: 10 mg/dL (ref 6–24)
Bilirubin Total: 0.6 mg/dL (ref 0.0–1.2)
CO2: 21 mmol/L (ref 20–29)
Calcium: 9.9 mg/dL (ref 8.7–10.2)
Chloride: 102 mmol/L (ref 96–106)
Creatinine, Ser: 0.9 mg/dL (ref 0.57–1.00)
Globulin, Total: 2.9 g/dL (ref 1.5–4.5)
Glucose: 92 mg/dL (ref 70–99)
Potassium: 4.5 mmol/L (ref 3.5–5.2)
Sodium: 138 mmol/L (ref 134–144)
Total Protein: 7.3 g/dL (ref 6.0–8.5)
eGFR: 78 mL/min/{1.73_m2} (ref >59)

## 2022-03-23 LAB — LIPID PANEL
Chol/HDL Ratio: 4.1 ratio (ref 0.0–4.4)
Cholesterol, Total: 198 mg/dL (ref 100–199)
HDL: 48 mg/dL (ref >39)
LDL Chol Calc (NIH): 121 mg/dL — ABNORMAL HIGH (ref 0–99)
Triglycerides: 163 mg/dL — ABNORMAL HIGH (ref 0–149)
VLDL Cholesterol Cal: 29 mg/dL (ref 5–40)

## 2022-03-23 LAB — HEMOGLOBIN A1C
Est. average glucose Bld gHb Est-mCnc: 103 mg/dL
Hgb A1c MFr Bld: 5.2 % (ref 4.8–5.6)

## 2022-03-28 ENCOUNTER — Encounter: Payer: Self-pay | Admitting: Nurse Practitioner

## 2022-03-28 DIAGNOSIS — Z6841 Body Mass Index (BMI) 40.0 and over, adult: Secondary | ICD-10-CM | POA: Diagnosis not present

## 2022-03-28 DIAGNOSIS — Z1231 Encounter for screening mammogram for malignant neoplasm of breast: Secondary | ICD-10-CM | POA: Diagnosis not present

## 2022-03-28 DIAGNOSIS — Z01419 Encounter for gynecological examination (general) (routine) without abnormal findings: Secondary | ICD-10-CM | POA: Diagnosis not present

## 2022-04-12 ENCOUNTER — Telehealth: Payer: Self-pay | Admitting: Nurse Practitioner

## 2022-04-12 NOTE — Telephone Encounter (Signed)
PA sent

## 2022-04-12 NOTE — Telephone Encounter (Signed)
Cover my meds [phone] Ref # [id number]  They need PA for [facility]

## 2022-06-22 ENCOUNTER — Other Ambulatory Visit: Payer: Self-pay | Admitting: Nurse Practitioner

## 2022-06-22 DIAGNOSIS — Z6841 Body Mass Index (BMI) 40.0 and over, adult: Secondary | ICD-10-CM

## 2022-06-25 ENCOUNTER — Encounter: Payer: Federal, State, Local not specified - PPO | Admitting: Nurse Practitioner

## 2022-07-09 DIAGNOSIS — Z6841 Body Mass Index (BMI) 40.0 and over, adult: Secondary | ICD-10-CM | POA: Diagnosis not present

## 2022-07-09 DIAGNOSIS — L239 Allergic contact dermatitis, unspecified cause: Secondary | ICD-10-CM | POA: Diagnosis not present

## 2022-07-26 ENCOUNTER — Other Ambulatory Visit: Payer: Self-pay | Admitting: Nurse Practitioner

## 2022-07-26 DIAGNOSIS — Z6841 Body Mass Index (BMI) 40.0 and over, adult: Secondary | ICD-10-CM

## 2022-08-27 ENCOUNTER — Ambulatory Visit (INDEPENDENT_AMBULATORY_CARE_PROVIDER_SITE_OTHER): Payer: Federal, State, Local not specified - PPO | Admitting: Nurse Practitioner

## 2022-08-27 ENCOUNTER — Encounter: Payer: Self-pay | Admitting: Nurse Practitioner

## 2022-08-27 VITALS — BP 138/88 | HR 105 | Temp 97.8°F | Ht 62.0 in | Wt 273.0 lb

## 2022-08-27 DIAGNOSIS — E781 Pure hyperglyceridemia: Secondary | ICD-10-CM

## 2022-08-27 DIAGNOSIS — Z0001 Encounter for general adult medical examination with abnormal findings: Secondary | ICD-10-CM | POA: Diagnosis not present

## 2022-08-27 DIAGNOSIS — Z6841 Body Mass Index (BMI) 40.0 and over, adult: Secondary | ICD-10-CM

## 2022-08-27 DIAGNOSIS — R739 Hyperglycemia, unspecified: Secondary | ICD-10-CM

## 2022-08-27 MED ORDER — WEGOVY 2.4 MG/0.75ML ~~LOC~~ SOAJ: 2.4000 mg | SUBCUTANEOUS | 0 refills | Status: DC

## 2022-08-27 NOTE — Assessment & Plan Note (Addendum)
Lost 4lbs in last 64months Total weight loss since 07/2021: 39lbs She admits to not being consistent with diet and exercise No adverse effects with wegovy Wt Readings from Last 3 Encounters:  08/27/22 273 lb (123.8 kg)  03/22/22 277 lb 12.8 oz (126 kg)  12/04/21 288 lb 12.8 oz (131 kg)    Advised to resume heart healthy diet and daily exercise (cardio and weight training) Maintain wegovy dose F/up in 86months

## 2022-08-27 NOTE — Patient Instructions (Addendum)
Go to lab for blood draw Maintain wegovy dose  Preventive Care 50-50 Years Old, Female Preventive care refers to lifestyle choices and visits with your health care provider that can promote health and wellness. Preventive care visits are also called wellness exams. What can I expect for my preventive care visit? Counseling Your health care provider may ask you questions about your: Medical history, including: Past medical problems. Family medical history. Pregnancy history. Current health, including: Menstrual cycle. Method of birth control. Emotional well-being. Home life and relationship well-being. Sexual activity and sexual health. Lifestyle, including: Alcohol, nicotine or tobacco, and drug use. Access to firearms. Diet, exercise, and sleep habits. Work and work Statistician. Sunscreen use. Safety issues such as seatbelt and bike helmet use. Physical exam Your health care provider will check your: Height and weight. These may be used to calculate your BMI (body mass index). BMI is a measurement that tells if you are at a healthy weight. Waist circumference. This measures the distance around your waistline. This measurement also tells if you are at a healthy weight and may help predict your risk of certain diseases, such as type 2 diabetes and high blood pressure. Heart rate and blood pressure. Body temperature. Skin for abnormal spots. What immunizations do I need?  Vaccines are usually given at various ages, according to a schedule. Your health care provider will recommend vaccines for you based on your age, medical history, and lifestyle or other factors, such as travel or where you work. What tests do I need? Screening Your health care provider may recommend screening tests for certain conditions. This may include: Lipid and cholesterol levels. Diabetes screening. This is done by checking your blood sugar (glucose) after you have not eaten for a while (fasting). Pelvic  exam and Pap test. Hepatitis B test. Hepatitis C test. HIV (human immunodeficiency virus) test. STI (sexually transmitted infection) testing, if you are at risk. Lung cancer screening. Colorectal cancer screening. Mammogram. Talk with your health care provider about when you should start having regular mammograms. This may depend on whether you have a family history of breast cancer. BRCA-related cancer screening. This may be done if you have a family history of breast, ovarian, tubal, or peritoneal cancers. Bone density scan. This is done to screen for osteoporosis. Talk with your health care provider about your test results, treatment options, and if necessary, the need for more tests. Follow these instructions at home: Eating and drinking  Eat a diet that includes fresh fruits and vegetables, whole grains, lean protein, and low-fat dairy products. Take vitamin and mineral supplements as recommended by your health care provider. Do not drink alcohol if: Your health care provider tells you not to drink. You are pregnant, may be pregnant, or are planning to become pregnant. If you drink alcohol: Limit how much you have to 0-1 drink a day. Know how much alcohol is in your drink. In the [place] one drink equals one 12 oz bottle of beer (355 mL), one 5 oz glass of wine (148 mL), or one 1 oz glass of hard liquor (44 mL). Lifestyle Brush your teeth every morning and night with fluoride toothpaste. Floss one time each day. Exercise for at least 30 minutes 5 or more days each week. Do not use any products that contain nicotine or tobacco. These products include cigarettes, chewing tobacco, and vaping devices, such as e-cigarettes. If you need help quitting, ask your health care provider. Do not use drugs. If you are sexually active, practice safe  sex. Use a condom or other form of protection to prevent STIs. If you do not wish to become pregnant, use a form of birth control. If you plan to become  pregnant, see your health care provider for a prepregnancy visit. Take aspirin only as told by your health care provider. Make sure that you understand how much to take and what form to take. Work with your health care provider to find out whether it is safe and beneficial for you to take aspirin daily. Find healthy ways to manage stress, such as: Meditation, yoga, or listening to music. Journaling. Talking to a trusted person. Spending time with friends and family. Minimize exposure to UV radiation to reduce your risk of skin cancer. Safety Always wear your seat belt while driving or riding in a vehicle. Do not drive: If you have been drinking alcohol. Do not ride with someone who has been drinking. When you are tired or distracted. While texting. If you have been using any mind-altering substances or drugs. Wear a helmet and other protective equipment during sports activities. If you have firearms in your house, make sure you follow all gun safety procedures. Seek help if you have been physically or sexually abused. What's next? Visit your health care provider once a year for an annual wellness visit. Ask your health care provider how often you should have your eyes and teeth checked. Stay up to date on all vaccines. This information is not intended to replace advice given to you by your health care provider. Make sure you discuss any questions you have with your health care provider. Document Revised: 01/10/2021 Document Reviewed: 01/10/2021 Elsevier Patient Education  [place].

## 2022-08-27 NOTE — Progress Notes (Signed)
Complete physical exam  Patient: Ana Johnson   DOB: 1971-02-03   53 y.o. Female  MRN: [id number] Visit Date: 08/27/2022  Subjective:    Chief Complaint  Patient presents with   Annual Exam    CPE Pt fasting  No concerns  Requesting records for pap   Ana Johnson is a 52 y.o. female who presents today for a complete physical exam. She reports consuming a low fat and low sodium diet.  Walking 5x/week-61mins each  She generally feels well. She reports sleeping well. She does not have additional problems to discuss today.  Vision:Yes Dental:Yes STD Screen:No  BP Readings from Last 3 Encounters:  08/27/22 138/88  03/22/22 126/76  12/04/21 136/82   Wt Readings from Last 3 Encounters:  08/27/22 273 lb (123.8 kg)  03/22/22 277 lb 12.8 oz (126 kg)  12/04/21 288 lb 12.8 oz (131 kg)   Most recent fall risk assessment:    03/22/2022    8:32 AM  Fall Risk   Falls in the past year? 0  Number falls in past yr: 0  Injury with Fall? 0   Depression screen:Yes - No Depression  Most recent depression screenings:    08/27/2022    1:48 PM 06/19/2021   11:41 AM  PHQ 2/9 Scores  PHQ - 2 Score 0 0  PHQ- 9 Score 0    HPI  Morbid obesity ([place]) Lost 4lbs in last 67months Total weight loss since 07/2021: 39lbs She admits to not being consistent with diet and exercise No adverse effects with wegovy Wt Readings from Last 3 Encounters:  08/27/22 273 lb (123.8 kg)  03/22/22 277 lb 12.8 oz (126 kg)  12/04/21 288 lb 12.8 oz (131 kg)    Advised to resume heart healthy diet and daily exercise (cardio and weight training) Maintain wegovy dose F/up in 62months  History reviewed. No pertinent past medical history. Past Surgical History:  Procedure Laterality Date   CESAREAN SECTION     CHOLECYSTECTOMY     WISDOM TOOTH EXTRACTION     Social History   Socioeconomic History   Marital status: Married    Spouse name: Not on file   Number of children: Not on file   Years of  education: Not on file   Highest education level: Not on file  Occupational History   Not on file  Tobacco Use   Smoking status: Never   Smokeless tobacco: Never  Vaping Use   Vaping Use: Never used  Substance and Sexual Activity   Alcohol use: No   Drug use: No   Sexual activity: Yes    Birth control/protection: I.U.D.  Other Topics Concern   Not on file  Social History Narrative   Tourist information centre manager for [employer]   1 daughter, 76 year old name delaney   Mom and dad live with her- in late 21s.   Social Determinants of Health   Financial Resource Strain: Not on file  Food Insecurity: Not on file  Transportation Needs: Not on file  Physical Activity: Not on file  Stress: Not on file  Social Connections: Not on file  Intimate Partner Violence: Not on file   No family status information on file.   Family History  Adopted: Yes   No Known Allergies  Patient Care Team: Zariah Jost, Charlene Brooke, NP as PCP - General (Internal Medicine) Ob/Gyn, [facility]   Medications: Outpatient Medications Prior to Visit  Medication Sig   Ascorbic Acid (VITAMIN C) 100 MG  tablet Take 100 mg by mouth daily.   cetirizine (ZYRTEC) 10 MG tablet Take 10 mg by mouth daily.   cholecalciferol (VITAMIN D3) 25 MCG (1000 UT) tablet Take 1,000 Units by mouth daily.   Insulin Pen Needle (PEN NEEDLES) 31G X 8 MM MISC 1 Units by Does not apply route daily.   PARAGARD INTRAUTERINE COPPER IUD IUD ParaGard T 380A 380 square mm intrauterine device   valsartan (DIOVAN) 80 MG tablet Take 1 tablet (80 mg total) by mouth daily.   vitamin B-12 (CYANOCOBALAMIN) 500 MCG tablet Take 500 mcg by mouth daily.   [DISCONTINUED] Semaglutide-Weight Management (WEGOVY) 2.4 MG/0.75ML SOAJ INJECT 2.4 MG INTO THE SKIN ONCE A WEEK.   No facility-administered medications prior to visit.   Review of Systems  Constitutional:  Negative for fever.  HENT:  Negative for congestion and sore throat.   Eyes:        Negative for  visual changes  Respiratory:  Negative for cough and shortness of breath.   Cardiovascular:  Negative for chest pain, palpitations and leg swelling.  Gastrointestinal:  Negative for blood in stool, constipation and diarrhea.  Genitourinary:  Negative for dysuria, frequency and urgency.  Musculoskeletal:  Negative for myalgias.  Skin:  Negative for rash.  Neurological:  Negative for dizziness and headaches.  Hematological:  Does not bruise/bleed easily.  Psychiatric/Behavioral:  Negative for suicidal ideas. The patient is not nervous/anxious.       Objective:  BP 138/88 (BP Location: Right Arm, Patient Position: Sitting, Cuff Size: Normal)   Pulse (!) 105   Temp 97.8 F (36.6 C) (Temporal)   Ht 5\' 2"  (1.575 m)   Wt 273 lb (123.8 kg)   SpO2 94%   BMI 49.93 kg/m     Physical Exam Vitals reviewed.  Constitutional:      General: She is not in acute distress.    Appearance: She is well-developed. She is obese.  HENT:     Right Ear: Tympanic membrane, ear canal and external ear normal.     Left Ear: Tympanic membrane, ear canal and external ear normal.     Nose: Nose normal.     Mouth/Throat:     Pharynx: No oropharyngeal exudate.  Eyes:     Extraocular Movements: Extraocular movements intact.     Conjunctiva/sclera: Conjunctivae normal.     Pupils: Pupils are equal, round, and reactive to light.  Cardiovascular:     Rate and Rhythm: Normal rate and regular rhythm.     Pulses: Normal pulses.     Heart sounds: Normal heart sounds.  Pulmonary:     Effort: Pulmonary effort is normal. No respiratory distress.     Breath sounds: Normal breath sounds.  Chest:     Chest wall: No tenderness.  Abdominal:     General: Bowel sounds are normal.     Palpations: Abdomen is soft.  Musculoskeletal:        General: Normal range of motion.     Cervical back: Normal range of motion and neck supple.     Right lower leg: No edema.     Left lower leg: No edema.  Lymphadenopathy:      Cervical: No cervical adenopathy.  Skin:    General: Skin is warm and dry.  Neurological:     Mental Status: She is alert and oriented to person, place, and time.     Deep Tendon Reflexes: Reflexes are normal and symmetric.  Psychiatric:  Mood and Affect: Mood normal.        Behavior: Behavior normal.        Thought Content: Thought content normal.     No results found for any visits on 08/27/22.    Assessment & Plan:    Routine Health Maintenance and Physical Exam  Immunization History  Administered Date(s) Administered   Influenza,inj,Quad PF,6+ Mos 04/30/2016, 05/13/2017, 05/18/2018, 06/14/2019, 06/19/2021   Influenza-Unspecified 05/29/2014, 05/01/2015   PFIZER Comirnaty(Gray Top)Covid-19 Tri-Sucrose Vaccine 11/02/2019, 11/23/2019, 08/05/2020   Tdap 05/13/2017   Health Maintenance  Topic Date Due   PAP SMEAR-Modifier  10/06/2022   COVID-19 Vaccine (4 - 2023-24 season) 09/12/2022 (Originally 03/29/2022)   INFLUENZA VACCINE  10/27/2022 (Originally 02/26/2022)   Hepatitis C Screening  08/28/2023 (Originally 10/17/1990)   Fecal DNA (Cologuard)  11/07/2023   DTaP/Tdap/Td (2 - Td or Tdap) 05/14/2027   HIV Screening  Completed   HPV VACCINES  Aged Out   Discussed health benefits of physical activity, and encouraged her to engage in regular exercise appropriate for her age and condition.  Problem List Items Addressed This Visit       Other   Hypertriglyceridemia   Relevant Orders   Lipid panel   Morbid obesity ([place])    Lost 4lbs in last 18months Total weight loss since 07/2021: 39lbs She admits to not being consistent with diet and exercise No adverse effects with wegovy Wt Readings from Last 3 Encounters:  08/27/22 273 lb (123.8 kg)  03/22/22 277 lb 12.8 oz (126 kg)  12/04/21 288 lb 12.8 oz (131 kg)    Advised to resume heart healthy diet and daily exercise (cardio and weight training) Maintain wegovy dose F/up in 41months      Relevant Medications    Semaglutide-Weight Management (WEGOVY) 2.4 MG/0.75ML SOAJ   Other Visit Diagnoses     Encounter for preventative adult health care exam with abnormal findings    -  Primary   Relevant Orders   Comprehensive metabolic panel   Hyperglycemia       Relevant Orders   Hemoglobin A1c   BMI 45.0-49.9, adult ([place])       Relevant Medications   Semaglutide-Weight Management (WEGOVY) 2.4 MG/0.75ML SOAJ      Return in about 3 months (around 11/26/2022) for Weight management, hyperlipidemia (fasting).     Wilfred Lacy, NP

## 2022-08-28 LAB — COMPREHENSIVE METABOLIC PANEL
ALT: 9 IU/L (ref 0–32)
AST: 11 IU/L (ref 0–40)
Albumin/Globulin Ratio: 1.7 (ref 1.2–2.2)
Albumin: 4.7 g/dL (ref 3.9–4.9)
Alkaline Phosphatase: 80 IU/L (ref 44–121)
BUN/Creatinine Ratio: 15 (ref 9–23)
BUN: 12 mg/dL (ref 6–24)
Bilirubin Total: 0.4 mg/dL (ref 0.0–1.2)
CO2: 20 mmol/L (ref 20–29)
Calcium: 9.8 mg/dL (ref 8.7–10.2)
Chloride: 103 mmol/L (ref 96–106)
Creatinine, Ser: 0.79 mg/dL (ref 0.57–1.00)
Globulin, Total: 2.8 g/dL (ref 1.5–4.5)
Glucose: 85 mg/dL (ref 70–99)
Potassium: 4.2 mmol/L (ref 3.5–5.2)
Sodium: 141 mmol/L (ref 134–144)
Total Protein: 7.5 g/dL (ref 6.0–8.5)
eGFR: 92 mL/min/{1.73_m2} (ref >59)

## 2022-08-28 LAB — HEMOGLOBIN A1C
Est. average glucose Bld gHb Est-mCnc: 105 mg/dL
Hgb A1c MFr Bld: 5.3 % (ref 4.8–5.6)

## 2022-08-28 LAB — LIPID PANEL
Chol/HDL Ratio: 4.3 ratio (ref 0.0–4.4)
Cholesterol, Total: 231 mg/dL — ABNORMAL HIGH (ref 100–199)
HDL: 54 mg/dL (ref >39)
LDL Chol Calc (NIH): 153 mg/dL — ABNORMAL HIGH (ref 0–99)
Triglycerides: 136 mg/dL (ref 0–149)
VLDL Cholesterol Cal: 24 mg/dL (ref 5–40)

## 2022-09-18 ENCOUNTER — Other Ambulatory Visit: Payer: Self-pay | Admitting: Nurse Practitioner

## 2022-10-18 ENCOUNTER — Other Ambulatory Visit: Payer: Self-pay | Admitting: Nurse Practitioner

## 2022-10-18 DIAGNOSIS — I10 Essential (primary) hypertension: Secondary | ICD-10-CM

## 2022-10-18 MED ORDER — WEGOVY 2.4 MG/0.75ML ~~LOC~~ SOAJ: 2.4000 mg | SUBCUTANEOUS | 0 refills | Status: DC

## 2022-10-18 NOTE — Addendum Note (Signed)
Addended by: Leana Gamer on: 10/18/2022 11:38 AM   Modules accepted: Orders

## 2022-11-12 ENCOUNTER — Other Ambulatory Visit: Payer: Self-pay | Admitting: Nurse Practitioner

## 2022-11-13 ENCOUNTER — Other Ambulatory Visit: Payer: Self-pay | Admitting: Nurse Practitioner

## 2022-11-26 ENCOUNTER — Ambulatory Visit: Payer: Federal, State, Local not specified - PPO | Admitting: Nurse Practitioner

## 2022-11-26 ENCOUNTER — Telehealth: Payer: Self-pay

## 2022-11-26 ENCOUNTER — Other Ambulatory Visit (HOSPITAL_COMMUNITY): Payer: Self-pay

## 2022-11-26 NOTE — Telephone Encounter (Signed)
PA request received via CMM for Wegovy 2.4MG /0.75ML auto-injectors  PA has been submitted to [employer] and has been APPROVED from 11/26/2022-11/26/2023  Key: [id number]

## 2022-12-05 ENCOUNTER — Other Ambulatory Visit: Payer: Self-pay | Admitting: Nurse Practitioner

## 2022-12-05 MED ORDER — WEGOVY 2.4 MG/0.75ML ~~LOC~~ SOAJ: 2.4000 mg | SUBCUTANEOUS | 0 refills | Status: DC

## 2022-12-13 NOTE — Telephone Encounter (Signed)
Message from plan: Your PA request has been approved. Additional information will be provided in the approval communication. (Message 1145). Authorization Expiration Date: November 26, 2023.

## 2022-12-30 ENCOUNTER — Ambulatory Visit: Payer: Federal, State, Local not specified - PPO | Admitting: Nurse Practitioner

## 2023-01-24 ENCOUNTER — Ambulatory Visit: Payer: Federal, State, Local not specified - PPO | Admitting: Nurse Practitioner

## 2023-02-06 DIAGNOSIS — M25561 Pain in right knee: Secondary | ICD-10-CM | POA: Diagnosis not present

## 2023-02-10 ENCOUNTER — Other Ambulatory Visit: Payer: Self-pay | Admitting: Nurse Practitioner

## 2023-02-20 DIAGNOSIS — M25561 Pain in right knee: Secondary | ICD-10-CM | POA: Diagnosis not present

## 2023-02-21 ENCOUNTER — Ambulatory Visit: Payer: Federal, State, Local not specified - PPO | Admitting: Nurse Practitioner

## 2023-03-11 ENCOUNTER — Other Ambulatory Visit: Payer: Self-pay | Admitting: Nurse Practitioner

## 2023-03-13 ENCOUNTER — Encounter (INDEPENDENT_AMBULATORY_CARE_PROVIDER_SITE_OTHER): Payer: Self-pay

## 2023-04-02 ENCOUNTER — Ambulatory Visit: Payer: Federal, State, Local not specified - PPO | Admitting: Nurse Practitioner

## 2023-05-14 ENCOUNTER — Ambulatory Visit: Payer: Federal, State, Local not specified - PPO | Admitting: Nurse Practitioner

## 2023-06-05 DIAGNOSIS — Z01419 Encounter for gynecological examination (general) (routine) without abnormal findings: Secondary | ICD-10-CM | POA: Diagnosis not present

## 2023-06-05 DIAGNOSIS — Z1231 Encounter for screening mammogram for malignant neoplasm of breast: Secondary | ICD-10-CM | POA: Diagnosis not present

## 2023-06-05 LAB — HM MAMMOGRAPHY

## 2023-06-13 ENCOUNTER — Other Ambulatory Visit: Payer: Self-pay | Admitting: Nurse Practitioner

## 2023-06-16 NOTE — Telephone Encounter (Signed)
Refill request for  Wegovy 2.4 mg LR  03/11/23, #3 ml, 2 rf LOV  08/27/22 FOV  08/25/23  Please review and advise.  Thanks. Dm/cma

## 2023-06-20 ENCOUNTER — Ambulatory Visit: Payer: Federal, State, Local not specified - PPO | Admitting: Nurse Practitioner

## 2023-07-13 ENCOUNTER — Other Ambulatory Visit: Payer: Self-pay | Admitting: Nurse Practitioner

## 2023-08-25 ENCOUNTER — Encounter: Payer: Self-pay | Admitting: Nurse Practitioner

## 2023-08-25 ENCOUNTER — Ambulatory Visit: Payer: Federal, State, Local not specified - PPO | Admitting: Nurse Practitioner

## 2023-08-25 DIAGNOSIS — E781 Pure hyperglyceridemia: Secondary | ICD-10-CM

## 2023-08-25 DIAGNOSIS — Z23 Encounter for immunization: Secondary | ICD-10-CM

## 2023-08-25 DIAGNOSIS — Z6841 Body Mass Index (BMI) 40.0 and over, adult: Secondary | ICD-10-CM

## 2023-08-25 DIAGNOSIS — R739 Hyperglycemia, unspecified: Secondary | ICD-10-CM

## 2023-08-25 LAB — COMPREHENSIVE METABOLIC PANEL
ALT: 17 U/L (ref 0–35)
AST: 14 U/L (ref 0–37)
Albumin: 4.6 g/dL (ref 3.5–5.2)
Alkaline Phosphatase: 77 U/L (ref 39–117)
BUN: 14 mg/dL (ref 6–23)
CO2: 26 meq/L (ref 19–32)
Calcium: 9.9 mg/dL (ref 8.4–10.5)
Chloride: 102 meq/L (ref 96–112)
Creatinine, Ser: 0.85 mg/dL (ref 0.40–1.20)
GFR: 79.64 mL/min (ref >60.00)
Glucose, Bld: 96 mg/dL (ref 70–99)
Potassium: 4 meq/L (ref 3.5–5.1)
Sodium: 137 meq/L (ref 135–145)
Total Bilirubin: 0.5 mg/dL (ref 0.2–1.2)
Total Protein: 8.2 g/dL (ref 6.0–8.3)

## 2023-08-25 LAB — LIPID PANEL
Cholesterol: 226 mg/dL — ABNORMAL HIGH (ref 0–200)
HDL: 60.1 mg/dL (ref >39.00)
LDL Cholesterol: 137 mg/dL — ABNORMAL HIGH (ref 0–99)
NonHDL: 165.47
Total CHOL/HDL Ratio: 4
Triglycerides: 140 mg/dL (ref 0.0–149.0)
VLDL: 28 mg/dL (ref 0.0–40.0)

## 2023-08-25 MED ORDER — WEGOVY 0.5 MG/0.5ML ~~LOC~~ SOAJ: 0.5000 mg | SUBCUTANEOUS | 0 refills | Status: DC

## 2023-08-25 NOTE — Progress Notes (Signed)
Established Patient Visit  Patient: Ana Johnson   DOB: [birth year]   53 y.o. Female  MRN: [id number] Visit Date: 08/25/2023  Subjective:    Chief Complaint  Patient presents with   Medical Managment for [place] Issues     3 month follow up weight management and hyperlipidemia; flu and shingle vaccine is due, mammogram report requested.    HPI Morbid obesity ([facility]) Minimal weight loss due to non compliance with f/up appts. Lost 17lbs with use of saxenda x 3months Lost 24lbs with use of wegovy x 12months Last wegovy dose 05/2023, 2.4mg  Stopped exercise due to knee injury in July, 2024. Knee pain has now resolved, so she plans to resume exercise 3x/week. Diet: has decrease portions, plans to start weight watchers No previous weight loss program. Wt Readings from Last 3 Encounters:  08/25/23 275 lb 12.8 oz (125.1 kg)  08/27/22 273 lb (123.8 kg)  03/22/22 277 lb 12.8 oz (126 kg)    Resume wegovy 0.5mg  weekly Entered referral with weight management clinic Check CMP, Lipid panel, hgbA1c F/up in 69month   Reviewed medical, surgical, and social history today  Medications: Outpatient Medications Prior to Visit  Medication Sig   Ascorbic Acid (VITAMIN C) 100 MG tablet Take 100 mg by mouth daily.   cetirizine (ZYRTEC) 10 MG tablet Take 10 mg by mouth daily.   cholecalciferol (VITAMIN D3) 25 MCG (1000 UT) tablet Take 1,000 Units by mouth daily.   PARAGARD INTRAUTERINE COPPER IUD IUD ParaGard T 380A 380 square mm intrauterine device   valsartan (DIOVAN) 80 MG tablet TAKE 1 TABLET BY MOUTH EVERY DAY   vitamin B-12 (CYANOCOBALAMIN) 500 MCG tablet Take 500 mcg by mouth daily.   [DISCONTINUED] Semaglutide-Weight Management (WEGOVY) 2.4 MG/0.75ML SOAJ INJECT 2.4 MG INTO THE SKIN ONCE A WEEK.   Insulin Pen Needle (PEN NEEDLES) 31G X 8 MM MISC 1 Units by Does not apply route daily.   No facility-administered medications prior to visit.   Reviewed past medical and social  history.   ROS per HPI above      Objective:  BP 128/80 (BP Location: Left Arm, Patient Position: Sitting, Cuff Size: Large)   Pulse 98   Temp 97.6 F (36.4 C) (Temporal)   Resp 18   Ht 5\' 2"  (1.575 m)   Wt 275 lb 12.8 oz (125.1 kg)   LMP  (Exact Date)   SpO2 97%   BMI 50.44 kg/m      Physical Exam Cardiovascular:     Rate and Rhythm: Normal rate and regular rhythm.     Pulses: Normal pulses.     Heart sounds: Normal heart sounds.  Pulmonary:     Effort: Pulmonary effort is normal.     Breath sounds: Normal breath sounds.  Neurological:     Mental Status: She is alert and oriented to person, place, and time.     Results for orders placed or performed in visit on 08/25/23  HM MAMMOGRAPHY  Result Value Ref Range   HM Mammogram 0-4 Bi-Rad 0-4 Bi-Rad, Self Reported Normal  Results for orders placed or performed in visit on 08/25/23  Comprehensive metabolic panel  Result Value Ref Range   Sodium 137 135 - 145 mEq/L   Potassium 4.0 3.5 - 5.1 mEq/L   Chloride 102 96 - 112 mEq/L   CO2 26 19 - 32 mEq/L   Glucose, Bld 96 70 - 99 mg/dL  BUN 14 6 - 23 mg/dL   Creatinine, Ser 1.19 0.40 - 1.20 mg/dL   Total Bilirubin 0.5 0.2 - 1.2 mg/dL   Alkaline Phosphatase 77 39 - 117 U/L   AST 14 0 - 37 U/L   ALT 17 0 - 35 U/L   Total Protein 8.2 6.0 - 8.3 g/dL   Albumin 4.6 3.5 - 5.2 g/dL   GFR 14.78 >29.56 mL/min   Calcium 9.9 8.4 - 10.5 mg/dL  Lipid panel  Result Value Ref Range   Cholesterol 226 (H) 0 - 200 mg/dL   Triglycerides 213.0 0.0 - 149.0 mg/dL   HDL 86.57 >84.69 mg/dL   VLDL 62.9 0.0 - 52.8 mg/dL   LDL Cholesterol 413 (H) 0 - 99 mg/dL   Total CHOL/HDL Ratio 4    NonHDL 165.47       Assessment & Plan:    Problem List Items Addressed This Visit     Hypertriglyceridemia   Relevant Orders   Amb Ref to Medical Weight Management   Lipid panel (Completed)   Morbid obesity ([facility]) - Primary   Minimal weight loss due to non compliance with f/up appts. Lost 17lbs  with use of saxenda x 3months Lost 24lbs with use of wegovy x 12months Last wegovy dose 05/2023, 2.4mg  Stopped exercise due to knee injury in July, 2024. Knee pain has now resolved, so she plans to resume exercise 3x/week. Diet: has decrease portions, plans to start weight watchers No previous weight loss program. Wt Readings from Last 3 Encounters:  08/25/23 275 lb 12.8 oz (125.1 kg)  08/27/22 273 lb (123.8 kg)  03/22/22 277 lb 12.8 oz (126 kg)    Resume wegovy 0.5mg  weekly Entered referral with weight management clinic Check CMP, Lipid panel, hgbA1c F/up in 55month      Relevant Medications   Semaglutide-Weight Management (WEGOVY) 0.5 MG/0.5ML SOAJ   Other Relevant Orders   Amb Ref to Medical Weight Management   Comprehensive metabolic panel (Completed)   Other Visit Diagnoses       Immunization due       Relevant Orders   Flu vaccine trivalent PF, 6mos and older(Flulaval,Afluria,Fluarix,Fluzone) (Completed)   Zoster Recombinant (Shingrix ) (Completed)      Return in about 4 weeks (around 09/22/2023) for Weight management.     Alysia Penna, NP

## 2023-08-25 NOTE — Assessment & Plan Note (Addendum)
Minimal weight loss due to non compliance with f/up appts. Lost 17lbs with use of saxenda x 3months Lost 24lbs with use of wegovy x 12months Last wegovy dose 05/2023, 2.4mg  Stopped exercise due to knee injury in July, 2024. Knee pain has now resolved, so she plans to resume exercise 3x/week. Diet: has decrease portions, plans to start weight watchers No previous weight loss program. Wt Readings from Last 3 Encounters:  08/25/23 275 lb 12.8 oz (125.1 kg)  08/27/22 273 lb (123.8 kg)  03/22/22 277 lb 12.8 oz (126 kg)    Resume wegovy 0.5mg  weekly Entered referral with weight management clinic Check CMP, Lipid panel, hgbA1c F/up in 76month

## 2023-08-25 NOTE — Patient Instructions (Signed)
Go to lab Maintain Heart healthy diet and daily exercise. Maintain current medications.

## 2023-08-26 MED ORDER — ZEPBOUND 2.5 MG/0.5ML ~~LOC~~ SOAJ: 2.5000 mg | SUBCUTANEOUS | 0 refills | Status: DC

## 2023-08-29 ENCOUNTER — Encounter: Payer: Self-pay | Admitting: Nurse Practitioner

## 2023-08-29 ENCOUNTER — Other Ambulatory Visit (HOSPITAL_COMMUNITY): Payer: Self-pay

## 2023-09-01 ENCOUNTER — Telehealth: Payer: Self-pay | Admitting: Pharmacist

## 2023-09-01 NOTE — Telephone Encounter (Signed)
Pharmacy Patient Advocate Encounter   Received notification from Physician's Office that prior authorization for Zepbound 2.5MG /0.5ML pen-injectors is required/requested.   Insurance verification completed.   The patient is insured through [facility] .   Per test claim: PA required; PA submitted to above mentioned insurance via CoverMyMeds Key/confirmation #/EOC [id number] Status is pending

## 2023-09-01 NOTE — Telephone Encounter (Signed)
PA request has been Submitted. New Encounter created for follow up. For additional info see Pharmacy Prior Auth telephone encounter from 02/03.

## 2023-09-02 DIAGNOSIS — E781 Pure hyperglyceridemia: Secondary | ICD-10-CM

## 2023-09-02 DIAGNOSIS — R739 Hyperglycemia, unspecified: Secondary | ICD-10-CM

## 2023-09-02 DIAGNOSIS — I10 Essential (primary) hypertension: Secondary | ICD-10-CM

## 2023-09-02 MED ORDER — QSYMIA 3.75-23 MG PO CP24: 1.0000 | ORAL_CAPSULE | Freq: Every morning | ORAL | 0 refills | Status: DC

## 2023-09-02 NOTE — Addendum Note (Signed)
Addended by: Alysia Penna L on: 09/02/2023 12:12 PM   Modules accepted: Orders

## 2023-09-02 NOTE — Telephone Encounter (Signed)
 Pharmacy Patient Advocate Encounter  Received notification from [facility] that Prior Authorization for Zepbound  2.5MG /0.5ML pen-injectors has been DENIED.  See denial reason below. No denial letter attached in CMM. Will attach denial letter to Media tab once received.   PA #/Case ID/Reference #:  [id number]

## 2023-09-03 ENCOUNTER — Telehealth: Payer: Self-pay

## 2023-09-03 ENCOUNTER — Other Ambulatory Visit (HOSPITAL_COMMUNITY): Payer: Self-pay

## 2023-09-03 NOTE — Telephone Encounter (Signed)
 Pharmacy Patient Advocate Encounter   Received notification from Patient Advice Request messages that prior authorization for Qsymia  3.75-23mg  ER  is required/requested.   Insurance verification completed.   The patient is insured through [facility] .   Per test claim: PA required; PA submitted to above mentioned insurance via CoverMyMeds Key/confirmation #/EOC [id number] Status is pending

## 2023-09-05 ENCOUNTER — Other Ambulatory Visit (HOSPITAL_COMMUNITY): Payer: Self-pay

## 2023-09-05 NOTE — Telephone Encounter (Signed)
 Pharmacy Patient Advocate Encounter  Received notification from [facility] that Prior Authorization for Qsymia  3.75mg -23mg  ER caps has been APPROVED from 09/05/23 to 03/03/24. Ran test claim, Copay is $75. This test claim was processed through [facility]- copay amounts may vary at other pharmacies due to pharmacy/plan contracts, or as the patient moves through the different stages of their insurance plan.

## 2023-09-05 NOTE — Telephone Encounter (Signed)
 Pharmacy Patient Advocate Encounter   Received notification from Physician's Office that prior authorization for Qsymia  3.75-23mg  ER caps is required/requested.   Insurance verification completed.   The patient is insured through [facility] .   Per test claim: PA required; PA submitted to above mentioned insurance via Phone Key/confirmation #/EOC -- Status is pending

## 2023-09-05 NOTE — Telephone Encounter (Signed)
 Pharmacy Patient Advocate Encounter  Received notification from [facility] that Prior Authorization for Qsymia  3.75-23mg   has been DENIED.      PA #/Case ID/Reference #: [id number]

## 2023-09-08 ENCOUNTER — Other Ambulatory Visit (HOSPITAL_COMMUNITY): Payer: Self-pay

## 2023-09-09 ENCOUNTER — Telehealth: Payer: Federal, State, Local not specified - PPO | Admitting: Physician Assistant

## 2023-09-09 DIAGNOSIS — J0110 Acute frontal sinusitis, unspecified: Secondary | ICD-10-CM

## 2023-09-09 MED ORDER — AMOXICILLIN-POT CLAVULANATE 875-125 MG PO TABS: 1.0000 | ORAL_TABLET | Freq: Two times a day (BID) | ORAL | 0 refills | Status: AC

## 2023-09-09 MED ORDER — FLUTICASONE PROPIONATE 50 MCG/ACT NA SUSP: 2.0000 | Freq: Every day | NASAL | 0 refills | Status: DC

## 2023-09-09 NOTE — Progress Notes (Signed)
Virtual Visit Consent   Ana Johnson, you are scheduled for a virtual visit with a [facility] provider today. Just as with appointments in the office, your consent must be obtained to participate. Your consent will be active for this visit and any virtual visit you may have with one of our providers in the next 365 days. If you have a MyChart account, a copy of this consent can be sent to you electronically.  As this is a virtual visit, video technology does not allow for your provider to perform a traditional examination. This may limit your provider's ability to fully assess your condition. If your provider identifies any concerns that need to be evaluated in person or the need to arrange testing (such as labs, EKG, etc.), we will make arrangements to do so. Although advances in technology are sophisticated, we cannot ensure that it will always work on either your end or our end. If the connection with a video visit is poor, the visit may have to be switched to a telephone visit. With either a video or telephone visit, we are not always able to ensure that we have a secure connection.  By engaging in this virtual visit, you consent to the provision of healthcare and authorize for your insurance to be billed (if applicable) for the services provided during this visit. Depending on your insurance coverage, you may receive a charge related to this service.  I need to obtain your verbal consent now. Are you willing to proceed with your visit today? WAVE CALZADA has provided verbal consent on 09/09/2023 for a virtual visit (video or telephone). Roney Jaffe, PA-C  Date: 09/09/2023 6:37 PM   Virtual Visit via Video Note   I, Ana Johnson, connected with  MEA OZGA  ([id number], 16-Sep-1970) on 09/09/23 at  6:30 PM EST by a video-enabled telemedicine application and verified that I am speaking with the correct person using two identifiers.  Location: Patient: Virtual Visit Location  Patient: Home Provider: Virtual Visit Location Provider: Home Office   I discussed the limitations of evaluation and management by telemedicine and the availability of in person appointments. The patient expressed understanding and agreed to proceed.    History of Present Illness: Ana Johnson is a 53 y.o. who identifies as a female who was assigned female at birth,  presents with a four day history of sinus issues. She reports nasal congestion and stuffiness, which has been ongoing for longer than four days.  She has noticed a greenish-yellowish nasal discharge when blowing her nose. She also reports pressure around her eyes and cheeks, and a mild headache. She denies fever or lethargy and has been able to continue working from home. She has been using Sudafed and a prescription nasal spray (unsure of name) for symptom relief, but notes that her symptoms seem to worsen at night, with alternating nostril stuffiness and occasional nasal drainage. She has not taken any antibiotics in the last thirty days and has no known drug allergies. Negative home covid test. .  Problems:  Patient Active Problem List   Diagnosis Date Noted   Essential hypertension 06/19/2021   Hypertriglyceridemia 06/19/2021   Vertigo 06/14/2019   Vitamin D deficiency 05/18/2018   Morbid obesity (HCC) 05/18/2018   Fatigue 05/18/2018    Allergies: No Known Allergies Medications:  Current Outpatient Medications:    amoxicillin-clavulanate (AUGMENTIN) 875-125 MG tablet, Take 1 tablet by mouth 2 (two) times daily for 7 days., Disp: 14 tablet, Rfl:  0   fluticasone (FLONASE) 50 MCG/ACT nasal spray, Place 2 sprays into both nostrils daily., Disp: 16 g, Rfl: 0   Ascorbic Acid (VITAMIN C) 100 MG tablet, Take 100 mg by mouth daily., Disp: , Rfl:    cetirizine (ZYRTEC) 10 MG tablet, Take 10 mg by mouth daily., Disp: , Rfl:    cholecalciferol (VITAMIN D3) 25 MCG (1000 UT) tablet, Take 1,000 Units by mouth daily., Disp: , Rfl:     PARAGARD INTRAUTERINE COPPER IUD IUD, ParaGard T 380A 380 square mm intrauterine device, Disp: , Rfl:    Phentermine-Topiramate (QSYMIA) 3.75-23 MG CP24, Take 1 capsule by mouth in the morning., Disp: 30 capsule, Rfl: 0   valsartan (DIOVAN) 80 MG tablet, TAKE 1 TABLET BY MOUTH EVERY DAY, Disp: 90 tablet, Rfl: 3   vitamin B-12 (CYANOCOBALAMIN) 500 MCG tablet, Take 500 mcg by mouth daily., Disp: , Rfl:   Observations/Objective: Patient is well-developed, well-nourished in no acute distress.  Resting comfortably  at home.  Head is normocephalic, atraumatic.  No labored breathing.  Speech is clear and coherent with logical content.  Patient is alert and oriented at baseline.    Assessment and Plan: 1. Acute non-recurrent frontal sinusitis (Primary) - amoxicillin-clavulanate (AUGMENTIN) 875-125 MG tablet; Take 1 tablet by mouth 2 (two) times daily for 7 days.  Dispense: 14 tablet; Refill: 0 - fluticasone (FLONASE) 50 MCG/ACT nasal spray; Place 2 sprays into both nostrils daily.  Dispense: 16 g; Refill: 0   Symptoms of congestion, stuffiness, and greenish-yellow nasal discharge for more than four days. No fever or lethargy. Some facial pressure around the eyes. Tried Sudafed and a nasal spray with minimal relief. Symptoms worsen at night with positional changes. -Prescribe Augmentin, take twice daily for seven days. -Trial Fluticasone   Follow Up Instructions: I discussed the assessment and treatment plan with the patient. The patient was provided an opportunity to ask questions and all were answered. The patient agreed with the plan and demonstrated an understanding of the instructions.  A copy of instructions were sent to the patient via MyChart unless otherwise noted below.     The patient was advised to call back or seek an in-person evaluation if the symptoms worsen or if the condition fails to improve as anticipated.    Kasandra Knudsen Mayers, PA-C

## 2023-09-09 NOTE — Patient Instructions (Signed)
Ana Johnson, thank you for joining Ana Jaffe, PA-C for today's virtual visit.  While this provider is not your primary care provider (PCP), if your PCP is located in our provider database this encounter information will be shared with them immediately following your visit.   A [facility] MyChart account gives you access to today's visit and all your visits, tests, and labs performed at [facility] " click here if you don't have a [name] account or go to [email or web address]  Consent: (Patient) Ana Johnson provided verbal consent for this virtual visit at the beginning of the encounter.  Current Medications:  Current Outpatient Medications:    amoxicillin-clavulanate (AUGMENTIN) 875-125 MG tablet, Take 1 tablet by mouth 2 (two) times daily for 7 days., Disp: 14 tablet, Rfl: 0   fluticasone (FLONASE) 50 MCG/ACT nasal spray, Place 2 sprays into both nostrils daily., Disp: 16 g, Rfl: 0   Ascorbic Acid (VITAMIN C) 100 MG tablet, Take 100 mg by mouth daily., Disp: , Rfl:    cetirizine (ZYRTEC) 10 MG tablet, Take 10 mg by mouth daily., Disp: , Rfl:    cholecalciferol (VITAMIN D3) 25 MCG (1000 UT) tablet, Take 1,000 Units by mouth daily., Disp: , Rfl:    PARAGARD INTRAUTERINE COPPER IUD IUD, ParaGard T 380A 380 square mm intrauterine device, Disp: , Rfl:    Phentermine-Topiramate (QSYMIA) 3.75-23 MG CP24, Take 1 capsule by mouth in the morning., Disp: 30 capsule, Rfl: 0   valsartan (DIOVAN) 80 MG tablet, TAKE 1 TABLET BY MOUTH EVERY DAY, Disp: 90 tablet, Rfl: 3   vitamin B-12 (CYANOCOBALAMIN) 500 MCG tablet, Take 500 mcg by mouth daily., Disp: , Rfl:    Medications ordered in this encounter:  Meds ordered this encounter  Medications   amoxicillin-clavulanate (AUGMENTIN) 875-125 MG tablet    Sig: Take 1 tablet by mouth 2 (two) times daily for 7 days.    Dispense:  14 tablet    Refill:  0    Supervising Provider:   Merrilee Johnson [[id number]]    fluticasone (FLONASE) 50 MCG/ACT nasal spray    Sig: Place 2 sprays into both nostrils daily.    Dispense:  16 g    Refill:  0    Supervising Provider:   Merrilee Johnson [[id number]]     *If you need refills on other medications prior to your next appointment, please contact your pharmacy*  Follow-Up: Call back or seek an in-person evaluation if the symptoms worsen or if the condition fails to improve as anticipated.  [facility] [phone]  Other Instructions Sinus Infection, Adult A sinus infection, also called sinusitis, is inflammation of your sinuses. Sinuses are hollow spaces in the bones around your face. Your sinuses are located: Around your eyes. In the middle of your forehead. Behind your nose. In your cheekbones. Mucus normally drains out of your sinuses. When your nasal tissues become inflamed or swollen, mucus can become trapped or blocked. This allows bacteria, viruses, and fungi to grow, which leads to infection. Most infections of the sinuses are caused by a virus. A sinus infection can develop quickly. It can last for up to 4 weeks (acute) or for more than 12 weeks (chronic). A sinus infection often develops after a cold. What are the causes? This condition is caused by anything that creates swelling in the sinuses or stops mucus from draining. This includes: Allergies. Asthma. Infection from bacteria or viruses. Deformities or blockages in your nose or  sinuses. Abnormal growths in the nose (nasal polyps). Pollutants, such as chemicals or irritants in the air. Infection from fungi. This is rare. What increases the risk? You are more likely to develop this condition if you: Have a weak body defense system (immune system). Do a lot of swimming or diving. Overuse nasal sprays. Smoke. What are the signs or symptoms? The main symptoms of this condition are pain and a feeling of pressure around the affected sinuses. Other symptoms include: Stuffy  nose or congestion that makes it difficult to breathe through your nose. Thick yellow or greenish drainage from your nose. Tenderness, swelling, and warmth over the affected sinuses. A cough that may get worse at night. Decreased sense of smell and taste. Extra mucus that collects in the throat or the back of the nose (postnasal drip) causing a sore throat or bad breath. Tiredness (fatigue). Fever. How is this diagnosed? This condition is diagnosed based on: Your symptoms. Your medical history. A physical exam. Tests to find out if your condition is acute or chronic. This may include: Checking your nose for nasal polyps. Viewing your sinuses using a device that has a light (endoscope). Testing for allergies or bacteria. Imaging tests, such as an MRI or CT scan. In rare cases, a bone biopsy may be done to rule out more serious types of fungal sinus disease. How is this treated? Treatment for a sinus infection depends on the cause and whether your condition is chronic or acute. If caused by a virus, your symptoms should go away on their own within 10 days. You may be given medicines to relieve symptoms. They include: Medicines that shrink swollen nasal passages (decongestants). A spray that eases inflammation of the nostrils (topical intranasal corticosteroids). Rinses that help get rid of thick mucus in your nose (nasal saline washes). Medicines that treat allergies (antihistamines). Over-the-counter pain relievers. If caused by bacteria, your health care provider may recommend waiting to see if your symptoms improve. Most bacterial infections will get better without antibiotic medicine. You may be given antibiotics if you have: A severe infection. A weak immune system. If caused by narrow nasal passages or nasal polyps, surgery may be needed. Follow these instructions at home: Medicines Take, use, or apply over-the-counter and prescription medicines only as told by your health care  provider. These may include nasal sprays. If you were prescribed an antibiotic medicine, take it as told by your health care provider. Do not stop taking the antibiotic even if you start to feel better. Hydrate and humidify  Drink enough fluid to keep your urine pale yellow. Staying hydrated will help to thin your mucus. Use a cool mist humidifier to keep the humidity level in your home above 50%. Inhale steam for 10-15 minutes, 3-4 times a day, or as told by your health care provider. You can do this in the bathroom while a hot shower is running. Limit your exposure to cool or dry air. Rest Rest as much as possible. Sleep with your head raised (elevated). Make sure you get enough sleep each night. General instructions  Apply a warm, moist washcloth to your face 3-4 times a day or as told by your health care provider. This will help with discomfort. Use nasal saline washes as often as told by your health care provider. Wash your hands often with soap and water to reduce your exposure to germs. If soap and water are not available, use hand sanitizer. Do not smoke. Avoid being around people who are smoking (  secondhand smoke). Keep all follow-up visits. This is important. Contact a health care provider if: You have a fever. Your symptoms get worse. Your symptoms do not improve within 10 days. Get help right away if: You have a severe headache. You have persistent vomiting. You have severe pain or swelling around your face or eyes. You have vision problems. You develop confusion. Your neck is stiff. You have trouble breathing. These symptoms may be an emergency. Get help right away. Call 911. Do not wait to see if the symptoms will go away. Do not drive yourself to the hospital. Summary A sinus infection is soreness and inflammation of your sinuses. Sinuses are hollow spaces in the bones around your face. This condition is caused by nasal tissues that become inflamed or swollen. The  swelling traps or blocks the flow of mucus. This allows bacteria, viruses, and fungi to grow, which leads to infection. If you were prescribed an antibiotic medicine, take it as told by your health care provider. Do not stop taking the antibiotic even if you start to feel better. Keep all follow-up visits. This is important. This information is not intended to replace advice given to you by your health care provider. Make sure you discuss any questions you have with your health care provider. Document Revised: 06/19/2021 Document Reviewed: 06/19/2021 Elsevier Patient Education  2024 Elsevier [employer].   If you have been instructed to have an in-person evaluation today at a local Urgent Care facility, please use the link below. It will take you to a list of all of our available [facility], including address, phone number and hours of operation. Please do not delay care.  [facility]  If you or a family member do not have a primary care provider, use the link below to schedule a visit and establish care. When you choose a [facility] primary care physician or advanced practice provider, you gain a long-term partner in health. Find a Primary Care Provider  Learn more about [facility]'s in-office and virtual care options: [facility] - Get Care Now

## 2023-09-26 ENCOUNTER — Ambulatory Visit: Payer: Federal, State, Local not specified - PPO | Admitting: Nurse Practitioner

## 2023-09-26 ENCOUNTER — Encounter: Payer: Self-pay | Admitting: Nurse Practitioner

## 2023-09-26 DIAGNOSIS — I10 Essential (primary) hypertension: Secondary | ICD-10-CM

## 2023-09-26 DIAGNOSIS — E782 Mixed hyperlipidemia: Secondary | ICD-10-CM | POA: Diagnosis not present

## 2023-09-26 MED ORDER — QSYMIA 7.5-46 MG PO CP24: 1.0000 | ORAL_CAPSULE | ORAL | 0 refills | Status: DC

## 2023-09-26 NOTE — Progress Notes (Signed)
 Established Patient Visit  Patient: Ana Johnson   DOB: 12-25-1970   53 y.o. Female  MRN: [id number] Visit Date: 09/26/2023  Subjective:    Chief Complaint  Patient presents with   Weight Management Screening    Follow up for weight management    HPI Morbid obesity (HCC) No weight loss noted in last 93month Reports dry month with qsymia, managed with increased oral hydration Exercise: walking 3x/week, each time Diet: following weight watcher diet-maintaining 30pts daily, 1100 to 1500kcal per day, low fat, high protein and low carb. Wt Readings from Last 3 Encounters:  09/26/23 275 lb (124.7 kg)  08/25/23 275 lb 12.8 oz (125.1 kg)  08/27/22 273 lb (123.8 kg)   Increased Qsymia dose Advised to schedule appointment with weight management clinic F/up in 93month if unable to schedule with weight clinic sooner  Essential hypertension Bp at goal despite use of Qsymia BP Readings from Last 3 Encounters:  09/26/23 130/78  08/25/23 128/80  08/27/22 138/88    Maintain valsartan dose  Reviewed medical, surgical, and social history today  Medications: Outpatient Medications Prior to Visit  Medication Sig   Ascorbic Acid (VITAMIN C) 100 MG tablet Take 100 mg by mouth daily.   cetirizine (ZYRTEC) 10 MG tablet Take 10 mg by mouth as needed for allergies or rhinitis.   cholecalciferol (VITAMIN D3) 25 MCG (1000 UT) tablet Take 1,000 Units by mouth daily.   fluticasone (FLONASE) 50 MCG/ACT nasal spray Place 2 sprays into both nostrils daily. (Patient taking differently: Place 2 sprays into both nostrils as needed for allergies or rhinitis.)   PARAGARD INTRAUTERINE COPPER IUD IUD ParaGard T 380A 380 square mm intrauterine device   valsartan (DIOVAN) 80 MG tablet TAKE 1 TABLET BY MOUTH EVERY DAY   vitamin B-12 (CYANOCOBALAMIN) 500 MCG tablet Take 500 mcg by mouth daily.   [DISCONTINUED] Phentermine-Topiramate (QSYMIA) 3.75-23 MG CP24 Take 1 capsule by mouth in the  morning.   No facility-administered medications prior to visit.   Reviewed past medical and social history.   ROS per HPI above      Objective:  BP 130/78 (BP Location: Left Arm, Patient Position: Sitting, Cuff Size: Large)   Pulse 98   Temp 98.2 F (36.8 C) (Temporal)   Ht 5\' 2"  (1.575 m)   Wt 275 lb (124.7 kg)   SpO2 97%   BMI 50.30 kg/m      Physical Exam Vitals and nursing note reviewed.  Cardiovascular:     Rate and Rhythm: Normal rate and regular rhythm.     Pulses: Normal pulses.     Heart sounds: Normal heart sounds.  Pulmonary:     Effort: Pulmonary effort is normal.     Breath sounds: Normal breath sounds.  Musculoskeletal:     Right lower leg: No edema.     Left lower leg: No edema.  Neurological:     Mental Status: She is alert and oriented to person, place, and time.     No results found for any visits on 09/26/23.    Assessment & Plan:    Problem List Items Addressed This Visit     Essential hypertension   Bp at goal despite use of Qsymia BP Readings from Last 3 Encounters:  09/26/23 130/78  08/25/23 128/80  08/27/22 138/88    Maintain valsartan dose      Relevant Medications   Phentermine-Topiramate (QSYMIA) 7.5-46 MG  CP24   Hyperlipidemia   Relevant Medications   Phentermine-Topiramate (QSYMIA) 7.5-46 MG CP24   Morbid obesity (HCC) - Primary   No weight loss noted in last 380month Reports dry month with qsymia, managed with increased oral hydration Exercise: walking 3x/week, each time Diet: following weight watcher diet-maintaining 30pts daily, 1100 to 1500kcal per day, low fat, high protein and low carb. Wt Readings from Last 3 Encounters:  09/26/23 275 lb (124.7 kg)  08/25/23 275 lb 12.8 oz (125.1 kg)  08/27/22 273 lb (123.8 kg)   Increased Qsymia dose Advised to schedule appointment with weight management clinic F/up in 380month if unable to schedule with weight clinic sooner      Relevant Medications    Phentermine-Topiramate (QSYMIA) 7.5-46 MG CP24   Return in about 4 weeks (around 10/24/2023) for Weight management, HTN.     Alysia Penna, NP

## 2023-09-26 NOTE — Assessment & Plan Note (Signed)
 Bp at goal despite use of Qsymia BP Readings from Last 3 Encounters:  09/26/23 130/78  08/25/23 128/80  08/27/22 138/88    Maintain valsartan dose

## 2023-09-26 NOTE — Assessment & Plan Note (Addendum)
 No weight loss noted in last 42month Reports dry month with qsymia, managed with increased oral hydration Exercise: walking 3x/week, each time Diet: following weight watcher diet-maintaining 30pts daily, 1100 to 1500kcal per day, low fat, high protein and low carb. Wt Readings from Last 3 Encounters:  09/26/23 275 lb (124.7 kg)  08/25/23 275 lb 12.8 oz (125.1 kg)  08/27/22 273 lb (123.8 kg)   Increased Qsymia dose Advised to schedule appointment with weight management clinic F/up in 42month if unable to schedule with weight clinic sooner

## 2023-09-26 NOTE — Patient Instructions (Signed)
 Core Strength Exercises Ask your health care provider which exercises are safe for you. Do exercises exactly as told by your health care provider and adjust them as directed. It is normal to feel mild stretching, pulling, tightness, or discomfort as you do these exercises. Stop right away if you feel sudden pain or your pain gets worse. Do not begin these exercises until told by your health care provider. Benefits of core strength exercises Core exercises help to build strength in the muscles between your ribs and your hips (abdominal muscles). These muscles help to support your body and keep your spine stable. It is important to maintain strength in your core to prevent injury and pain. Some activities, such as yoga and Pilates, can help to strengthen core muscles. You can also strengthen core muscles with exercises at home. It is important to talk to your health care provider before you start a new exercise routine. Core strength exercises can: Reduce back pain. Help to rebuild strength after a back or spine injury. Help to prevent injury during physical activity, especially injuries to the back, hips, and knees. How to do core strength exercises Repeat these exercises 10-15 times, or until you are tired. Stop if you feel any pain while doing these exercises. Contact your health care provider if your pain continues or gets worse while doing or after doing core strength exercises. For strength exercises that are done on the floor, use a padded yoga mat or an exercise mat. Bridging  Lie on your back on a firm surface with your knees bent and your feet flat on the floor. Raise your hips so that your knees, hips, and shoulders together form a straight line. Do not excessively arch your back. Keep your abdominal muscles tight. Hold this position for 3-5 seconds. Slowly lower your hips to the starting position. Let your muscles relax completely between repetitions. Single-leg bridge  Lie on your  back on a firm surface with your knees bent and your feet flat on the floor. Raise your hips so that your knees, hips, and shoulders together form a straight line. Do not excessively arch your back. Keep your abdominal muscles tight. Lift one foot off the floor while maintaining alignment in your knees, hips, and shoulders. Then, completely straighten the lifted leg. Hold this position for 3-5 seconds. Put the straight leg back down in the bent position. Slowly lower your hips to the starting position. Repeat these steps using your other leg. Side bridge  Lie on your side with your knees bent. Prop yourself up on the elbow that is near the floor. Using your abdominal muscles and the elbow you are propped up on, raise your body off the floor. Raise your hip so that your shoulder, hip, and foot together form a straight line. Hold this position for 10 seconds. Keep your head and neck raised and away from your shoulder (in their normal, neutral position). Keep your abdominal muscles tight. Slowly lower your hip to the starting position. Repeat and try to hold this position longer, working your way up to 30 seconds. Abdominal crunch  Lie on your back on a firm surface. Bend your knees and keep your feet flat on the floor. Cross your arms over your chest. Without bending your neck, tip your chin slightly toward your chest. Tighten your abdominal muscles as you lift your chest just high enough to lift your shoulder blades off the floor. Do not hold your breath. You can do this with short lifts or  long lifts. Slowly return to the starting position. Bird dog  Get on your hands and knees, with your legs shoulder-width apart and your arms under your shoulders. Keep your back straight. Tighten your abdominal muscles. Raise one of your legs off the floor and straighten it. Try to keep it parallel to the floor. Slowly lower your leg to the starting position. Raise one of your arms off the floor and  straighten it. Try to keep it parallel to the floor. Slowly lower your arm to the starting position. Repeat with the other arm and leg. If possible, try raising a leg and an arm at the same time, on opposite sides of the body. For example, raise your left hand and your right leg. Raford Pitcher on your belly. Prop up your body onto your forearms and your feet, keeping your legs straight. Your body should make a straight line between your shoulders and feet. Hold this position for 10 seconds while keeping your abdominal muscles tight. Lower your body to the starting position. Repeat and try to hold this position longer, working your way up to 30 seconds. Cross-core strengthening  Stand with your feet shoulder-width apart. Hold a ball out in front of you. Keep your arms straight. Tighten your abdominal muscles and slowly rotate at your waist from side to side. Keep your feet flat. Once you are comfortable, try repeating this exercise with a heavier ball. Top core strengthening  Stand about 18 inches (46 cm) out from a wall, with your back to the wall. Keep your feet flat and shoulder-width apart. Tighten your abdominal muscles. Bend your hips and knees. Slowly reach between your legs to touch the wall behind you. Slowly stand back up. Raise your arms over your head and reach behind you. Return to the starting position. General tips Do not do any exercises that cause pain. If you have pain while exercising, talk to your health care provider. Always stretch before and after doing these exercises. This can help prevent injury. Maintain a healthy weight. Ask your health care provider what weight is healthy for you. Contact a health care provider if: You have back pain that gets worse or does not go away. You feel pain while doing core strength exercises. Get help right away if: You have severe pain that does not get better with medicine. Summary Core exercises help to build strength in the  muscles between your ribs and your waist. Core muscles help to support your body and keep your spine stable. Some activities, such as yoga and Pilates, can help to strengthen core muscles. Core strength exercises can help back pain and can prevent injury. Stop if you feel any pain while doing core strength exercises. This information is not intended to replace advice given to you by your health care provider. Make sure you discuss any questions you have with your health care provider. Document Revised: 01/09/2021 Document Reviewed: 04/11/2020 Elsevier Patient Education  2024 [employer].

## 2023-09-29 ENCOUNTER — Encounter: Payer: Self-pay | Admitting: Nurse Practitioner

## 2023-10-08 ENCOUNTER — Encounter (INDEPENDENT_AMBULATORY_CARE_PROVIDER_SITE_OTHER): Payer: Self-pay

## 2023-10-27 ENCOUNTER — Ambulatory Visit: Payer: Federal, State, Local not specified - PPO | Admitting: Nurse Practitioner

## 2023-10-28 ENCOUNTER — Ambulatory Visit (INDEPENDENT_AMBULATORY_CARE_PROVIDER_SITE_OTHER): Payer: Self-pay | Admitting: Family Medicine

## 2023-10-28 ENCOUNTER — Encounter (INDEPENDENT_AMBULATORY_CARE_PROVIDER_SITE_OTHER): Payer: Self-pay | Admitting: Family Medicine

## 2023-10-28 VITALS — BP 130/73 | HR 92 | Temp 98.1°F | Ht 62.0 in | Wt 286.0 lb

## 2023-10-28 DIAGNOSIS — I10 Essential (primary) hypertension: Secondary | ICD-10-CM

## 2023-10-28 DIAGNOSIS — Z0289 Encounter for other administrative examinations: Secondary | ICD-10-CM

## 2023-10-28 DIAGNOSIS — R739 Hyperglycemia, unspecified: Secondary | ICD-10-CM

## 2023-10-28 DIAGNOSIS — Z6841 Body Mass Index (BMI) 40.0 and over, adult: Secondary | ICD-10-CM

## 2023-10-28 NOTE — Progress Notes (Signed)
 Ana Grippe, DO, ABFM, ABOM Bariatric physician [address] [place], [place], [place] [address] Office: [phone]  /  Fax: [phone]   Initial Evaluation:  Ana Johnson was seen in clinic today to evaluate for obesity. She is interested in losing weight to improve overall health and reduce the risk of weight related complications. She presents today to review program treatment options, initial physical assessment, and evaluation.       She was referred by: PCP  When asked how has your weight affected you? She states: Contributed to medical problems, Contributed to orthopedic problems or mobility issues, Having fatigue, and Having poor endurance  Contributing factors to her weight change: Moderate to high levels of stress, Reduced physical activity, Eating patterns, Pregnancy, Life event (COVID).   Some associated conditions: Hypertension, Arthritis: knees, and Hyperlipidemia  Current nutrition plan: None  Current level of physical activity: Walking on treadmill 30 minutes, 3 days per week.  Current or previous pharmacotherapy: GLP-1 [facility], lost 30 lbs within a year and was able to maintain some of the wt loss, stopped medication d/t cost) & Qysmia (only took it for 1 mo or so, states "it made me thirsty and overall I did not like the way it made me feel").   History reviewed. No pertinent past medical history.  Current Outpatient Medications  Medication Instructions   cetirizine (ZYRTEC) 10 mg, As needed   cholecalciferol (VITAMIN D3) 1,000 Units, Daily   cyanocobalamin (VITAMIN B12) 500 mcg, Daily   PARAGARD INTRAUTERINE COPPER IUD IUD ParaGard T 380A 380 square mm intrauterine device   valsartan (DIOVAN) 80 mg, Oral, Daily   vitamin C 100 mg, Daily     No Known Allergies   Past Surgical History:  Procedure Laterality Date   CESAREAN SECTION     CHOLECYSTECTOMY     WISDOM TOOTH EXTRACTION       Family History  Adopted: Yes     Objective:  BP 130/73    Pulse 92   Temp 98.1 F (36.7 C)   Ht 5\' 2"  (1.575 m)   Wt 286 lb (129.7 kg)   SpO2 95%   BMI 52.31 kg/m  She was weighed on the bioimpedance scale: Body mass index is 52.31 kg/m.  Visceral Fat %: 21 , Body Fat %: 54.2  Weight Lost Since Last Visit: NA  Weight Gained Since Last Visit: NA   Vitals Temp: 98.1 F (36.7 C) BP: 130/73 Pulse Rate: 92 SpO2: 95 %   Anthropometric Measurements Height: 5\' 2"  (1.575 m) Weight: 286 lb (129.7 kg) BMI (Calculated): 52.3 Weight at Last Visit: NA Weight Lost Since Last Visit: NA Weight Gained Since Last Visit: NA Starting Weight: NA Total Weight Loss (lbs):  (0 kg) (NA) Peak Weight: 312lb Waist Measurement : 0 inches (NA)   Body Composition  Body Fat %: 54.2 % Fat Mass (lbs): 155.4 lbs Muscle Mass (lbs): 124.6 lbs Total Body Water (lbs): 96.6 lbs Visceral Fat Rating : 21   Other Clinical Data A1c: 0 mg/dL (NA) RMR: 0 (NA) Fasting: NO Labs: NO Today's Visit #: -- (NA) Starting Date:  (NA) Comments: Info Session    General: Well Developed, well nourished, and in no acute distress.  HEENT: Normocephalic, atraumatic; EOMI, sclerae are anicteric. Skin: Warm and dry, good turgor Chest:  Normal excursion, shape, no gross ABN Respiratory: No conversational dyspnea; speaking in full sentences NeuroM-Sk:  Normal gross ROM * 4 extremities  Psych: A and O *3, insight adequate, mood- full  Assessment and Plan:   FOR THE DISEASE OF OBESITY:  BMI 50.0-59.9, adult (HCC) Morbid obesity (HCC) Assessment & Plan: We reviewed anthropometrics, biometrics, associated medical conditions and contributing factors with patient. Ana Johnson would benefit from a medically tailored reduced calorie nutrional plan based on her REE (resting energy expenditure), which will be determined by indirect calorimetry.  We will also assess for cardiometabolic risk and nutritional derangements via fasting labs at intake appointment.    Obesity Treatment /  Action Plan:   she was weighed on the bioimpedance scale and results were discussed and documented in the synopsis.   Ana Johnson will complete provided nutritional and psychosocial assessment questionnaire before the next appointment.  she will be scheduled for indirect calorimetry to determine resting energy expenditure in a fasting state.  This will allow [place] to create a reduced calorie, high-protein meal plan to promote loss of fat mass while preserving muscle mass.  We will also assess for cardiometabolic risk and nutritional derangements via an ECG and fasting serologies at her next appointment.  she was encouraged to work on amassing support from family and friends to begin their weight loss journey.   Work on eliminating or reducing the presence of highly processed, poorly nutritious, calorie-dense foods in the home.   Obesity Education Performed Today:  Patient was counseled on nutritional approaches to weight loss and benefits of reducing processed foods and consuming plant-based foods and high quality protein as part of nutritional weight management program.   We discussed the importance of long term lifestyle changes which include nutrition, exercise and behavioral modifications as well as the importance of customizing this to her specific health and social needs.   We discussed the benefits of reaching a healthier weight to alleviate the symptoms of existing conditions and reduce the risks of the biomechanical, metabolic and psychological effects of obesity.  Was counseled on the health benefits of losing 5%-10% of total body weight.  Was counseled on our cognitive behavorial therapy program, lead by our bariatric psychologist, who focuses on emotional eating and creating positive behavorial change.  Was counseled on bariatric pharmacotherapy and how this may be used as an adjunct in their weight management   Ana Johnson appears to be in the action stage of change and states  they are ready to start intensive lifestyle modifications and behavioral modifications.  It was recommended that she follow up in the next 1-2 weeks to review the above steps, and to continue with treatment of their chronic disease state of obesity   FOR OTHER CONDITIONS RELATED TO THE DISEASE OF OBESITY:  Essential hypertension Assessment & Plan: Last 3 blood pressure readings in our office are as follows: BP Readings from Last 3 Encounters:  10/28/23 130/73  09/26/23 130/78  08/25/23 128/80   The 10-year ASCVD risk score (Arnett DK, et al., 2019) is: 3.9%  Lab Results  Component Value Date   CREATININE 0.85 08/25/2023   Blood pressure is stable on Valsartan 80 mg daily. Continue medication. Losing 10% of body weight through a low sodium diet may improve blood pressure control    Hyperglycemia Assessment & Plan: Most recent Hemoglobin A1c:  Lab Results  Component Value Date   HGBA1C 5.3 08/27/2022   Per hx. She would benefit from a high protein low-carb meal plan.We will check fasting blood glucose and insulin levels with intake labs at next OV if she decides to join our program.    Attestations:   Reviewed by clinician on day of visit:  allergies, medications, problem list, medical history, surgical history, family history, social history, and previous encounter notes pertinent to obesity diagnosis.  40 minutes was spent today on this visit including the above counseling, pre-visit chart review, and post-visit documentation.  Over 50% of this time was spent in direct, face-to-face counseling and coordination of care  I, Special Randolm Idol, acting as a medical scribe for Thomasene Lot, DO., have compiled all relevant documentation for today's office visit on behalf of Thomasene Lot, DO, while in the presence of [employer], DO.  I have reviewed the above documentation for accuracy and completeness, and I agree with the above. Ana Johnson, D.O.  The 21st Century Cures  Act was signed into law in 2016 which includes the topic of electronic health records.  This provides immediate access to information in MyChart.  This includes consultation notes, operative notes, office notes, lab results and pathology reports.  If you have any questions about what you read please let us know at your next visit so we can discuss your concerns and take corrective action if need be.  We are right here with you!

## 2023-11-15 ENCOUNTER — Encounter (HOSPITAL_BASED_OUTPATIENT_CLINIC_OR_DEPARTMENT_OTHER): Payer: Self-pay | Admitting: Emergency Medicine

## 2023-11-15 ENCOUNTER — Other Ambulatory Visit: Payer: Self-pay

## 2023-11-15 ENCOUNTER — Emergency Department (HOSPITAL_BASED_OUTPATIENT_CLINIC_OR_DEPARTMENT_OTHER)
Admission: EM | Admit: 2023-11-15 | Discharge: 2023-11-15 | Disposition: A | Discharge status: Home / Self Care | Attending: Emergency Medicine | Admitting: Emergency Medicine

## 2023-11-15 DIAGNOSIS — Y93E5 Activity, floor mopping and cleaning: Secondary | ICD-10-CM | POA: Insufficient documentation

## 2023-11-15 DIAGNOSIS — Z23 Encounter for immunization: Secondary | ICD-10-CM | POA: Diagnosis not present

## 2023-11-15 DIAGNOSIS — W25XXXA Contact with sharp glass, initial encounter: Secondary | ICD-10-CM | POA: Insufficient documentation

## 2023-11-15 DIAGNOSIS — S61212A Laceration without foreign body of right middle finger without damage to nail, initial encounter: Secondary | ICD-10-CM | POA: Diagnosis not present

## 2023-11-15 MED ORDER — TETANUS-DIPHTH-ACELL PERTUSSIS 5-2.5-18.5 LF-MCG/0.5 IM SUSY
0.5000 mL | PREFILLED_SYRINGE | Freq: Once | INTRAMUSCULAR | Status: AC
  Administered 2023-11-15: 0.5 mL via INTRAMUSCULAR
  Filled 2023-11-15: qty 0.5

## 2023-11-15 MED ORDER — LIDOCAINE HCL (PF) 1 % IJ SOLN
10.0000 mL | Freq: Once | INTRAMUSCULAR | Status: AC
  Administered 2023-11-15: 5 mL
  Filled 2023-11-15: qty 10

## 2023-11-15 NOTE — ED Provider Notes (Signed)
 [place] [facility] Provider Note   CSN: [id number] Arrival date & time: 11/15/23  2148     History  Chief Complaint  Patient presents with   Laceration    Ana Johnson is a 53 y.o. female.  She is right-hand dominant.  Complaining of a laceration to her right middle finger that she cut on some broken glass that she was cleaning up.  No foreign body sensation.  She is not sure when her last tetanus shot was.  No numbness or weakness.  No other complaints.  The history is provided by the patient.  Laceration Location:  Finger Finger laceration location:  R middle finger Length:  3.5 Depth:  Cutaneous Quality: straight   Bleeding: controlled   Time since incident:  1 hour Laceration mechanism:  Broken glass Foreign body present:  No foreign bodies Relieved by:  Pressure Tetanus status:  Out of date Associated symptoms: no focal weakness, no numbness and no swelling        Home Medications Prior to Admission medications   Medication Sig Start Date End Date Taking? Authorizing Provider  Ascorbic Acid (VITAMIN C) 100 MG tablet Take 100 mg by mouth daily.    [provider]  cetirizine (ZYRTEC) 10 MG tablet Take 10 mg by mouth as needed for allergies or rhinitis.    [provider]  cholecalciferol (VITAMIN D3) 25 MCG (1000 UT) tablet Take 1,000 Units by mouth daily.    [provider]  PARAGARD INTRAUTERINE COPPER IUD IUD ParaGard T 380A 380 square mm intrauterine device    [provider]  valsartan  (DIOVAN ) 80 MG tablet TAKE 1 TABLET BY MOUTH EVERY DAY 10/18/22   Nche, Connye Delaine, NP  vitamin B-12 (CYANOCOBALAMIN ) 500 MCG tablet Take 500 mcg by mouth daily.    [provider]      Allergies    Patient has no known allergies.    Review of Systems   Review of Systems  Neurological:  Negative for focal weakness.    Physical Exam Updated Vital Signs BP (!) 159/85   Pulse 100    Temp 98.2 F (36.8 C)   Resp 20   Ht 5\' 2"  (1.575 m)   Wt 129.7 kg   SpO2 95%   BMI 52.30 kg/m  Physical Exam Constitutional:      Appearance: Normal appearance. She is well-developed.  HENT:     Head: Normocephalic and atraumatic.  Eyes:     Conjunctiva/sclera: Conjunctivae normal.  Musculoskeletal:        General: Tenderness and signs of injury present.     Cervical back: Neck supple.     Comments: She has a linear laceration about 3 and half centimeters going down the dorsum of her right middle finger from her PIP to her DIP.  Her extensor is intact nail is uninvolved.  Cap refill brisk.  Sensory intact to light touch.  Skin:    General: Skin is warm and dry.  Neurological:     General: No focal deficit present.     Mental Status: She is alert.     GCS: GCS eye subscore is 4. GCS verbal subscore is 5. GCS motor subscore is 6.     Sensory: No sensory deficit.     Motor: No weakness.     ED Results / Procedures / Treatments   Labs (all labs ordered are listed, but only abnormal results are displayed) Labs Reviewed - No data to  display  EKG None  Radiology No results found.  Procedures .Laceration Repair  Date/Time: 11/15/2023 10:39 PM  Performed by: Tonya Fredrickson, MD Authorized by: Tonya Fredrickson, MD   Consent:    Consent obtained:  Verbal   Consent given by:  Patient   Risks, benefits, and alternatives were discussed: yes     Risks discussed:  Infection, nerve damage, poor wound healing, pain, retained foreign body, tendon damage and vascular damage   Alternatives discussed:  No treatment, delayed treatment and referral Universal protocol:    Procedure explained and questions answered to patient or proxy's satisfaction: yes     Patient identity confirmed:  Verbally with patient Anesthesia:    Anesthesia method:  Nerve block   Block location:  Digital   Block anesthetic:  Lidocaine  1% w/o epi   Block injection procedure:  Negative aspiration for  blood and anatomic landmarks identified   Block outcome:  Anesthesia achieved Laceration details:    Location:  Finger   Finger location:  R long finger   Length (cm):  3.5 Pre-procedure details:    Preparation:  Patient was prepped and draped in usual sterile fashion Treatment:    Area cleansed with:  Saline   Amount of cleaning:  Standard   Irrigation solution:  Sterile saline   Debridement:  None Skin repair:    Repair method:  Sutures   Suture size:  5-0   Suture material:  Nylon   Suture technique:  Simple interrupted   Number of sutures:  4 Approximation:    Approximation:  Close Repair type:    Repair type:  Simple Post-procedure details:    Dressing:  Non-adherent dressing   Procedure completion:  Tolerated well, no immediate complications     Medications Ordered in ED Medications  lidocaine  (PF) (XYLOCAINE ) 1 % injection 10 mL (has no administration in time range)  Tdap (BOOSTRIX ) injection 0.5 mL (has no administration in time range)    ED Course/ Medical Decision Making/ A&P                                 Medical Decision Making Risk Prescription drug management.   This patient complains of laceration right middle finger; this involves an extensive number of treatment Options and is a complaint that carries with it a high risk of complications and morbidity. The differential includes laceration, tendon injury, foreign body Previous records obtained and reviewed in epic no recent admissions Social determinants considered, no significant barriers Critical Interventions: None  After the interventions stated above, I reevaluated the patient and found patient's wound to be hemostatic Admission and further testing considered, no indications for admission or further workup at this time.  Return instructions discussed.         Final Clinical Impression(s) / ED Diagnoses Final diagnoses:  Laceration of right middle finger without foreign body without  damage to nail, initial encounter    Rx / DC Orders ED Discharge Orders     None         Tonya Fredrickson, MD 11/16/23 1026

## 2023-11-15 NOTE — Discharge Instructions (Signed)
 You are seen in the emergency department for laceration to your right middle finger.  The area was sutured.  You may get this area wet, keep clean with soap and water.  Suture removal in 10 to 12 days.  Return sooner if any signs of infection.

## 2023-11-15 NOTE — ED Triage Notes (Signed)
 Pt with lac to RT middle finger with a piece of glass tonight

## 2023-12-01 ENCOUNTER — Ambulatory Visit (INDEPENDENT_AMBULATORY_CARE_PROVIDER_SITE_OTHER): Admitting: Family Medicine

## 2023-12-01 ENCOUNTER — Encounter (INDEPENDENT_AMBULATORY_CARE_PROVIDER_SITE_OTHER): Payer: Self-pay | Admitting: Family Medicine

## 2023-12-01 VITALS — BP 131/74 | HR 85 | Temp 98.3°F | Ht 62.0 in | Wt 297.0 lb

## 2023-12-01 DIAGNOSIS — R0602 Shortness of breath: Secondary | ICD-10-CM

## 2023-12-01 DIAGNOSIS — E669 Obesity, unspecified: Secondary | ICD-10-CM

## 2023-12-01 DIAGNOSIS — E559 Vitamin D deficiency, unspecified: Secondary | ICD-10-CM

## 2023-12-01 DIAGNOSIS — Z1331 Encounter for screening for depression: Secondary | ICD-10-CM | POA: Diagnosis not present

## 2023-12-01 DIAGNOSIS — I10 Essential (primary) hypertension: Secondary | ICD-10-CM

## 2023-12-01 DIAGNOSIS — R5383 Other fatigue: Secondary | ICD-10-CM | POA: Diagnosis not present

## 2023-12-01 DIAGNOSIS — E538 Deficiency of other specified B group vitamins: Secondary | ICD-10-CM

## 2023-12-01 DIAGNOSIS — E7849 Other hyperlipidemia: Secondary | ICD-10-CM | POA: Diagnosis not present

## 2023-12-01 DIAGNOSIS — Z6841 Body Mass Index (BMI) 40.0 and over, adult: Secondary | ICD-10-CM

## 2023-12-01 NOTE — Progress Notes (Signed)
 Ana Johnson, D.O.  AB[facility], ABOM Specializing in Clinical Bariatric Medicine Office located at: [address] [place], [place]  [address]   Bariatric Medicine Visit  Dear Ana Johnson, Ana Delaine, NP   Thank you for referring Ana Johnson to our clinic today for evaluation.  We performed a consultation to discuss her options for treatment and educate the patient on her disease state.  The following note includes my evaluation and treatment recommendations.   Please do not hesitate to reach out to me directly if you have any further concerns.    Assessment and Plan:  No orders of the defined types were placed in this encounter.   There are no discontinued medications.   No orders of the defined types were placed in this encounter.       FOR THE DISEASE OF OBESITY:  BMI 50.0-59.9, adult (HCC) - START 54.31 Assessment & Plan:  Recommended Dietary Goals Ana Johnson is currently in the action stage of change. As such, her goal is to start our weight management plan.  She has agreed to implement: Category 3 MP   Behavioral Intervention We discussed the following Behavioral Modification Strategies today: Substitutes for milk, increasing lean protein intake to established goals, avoiding skipping meals, and identifying sources and decreasing liquid calories  Additional resources provided today: Handout on CAT 3 meal plan  Evidence-based interventions for health behavior change were utilized today including the discussion of self monitoring techniques, problem-solving barriers and SMART goal setting techniques.    Pt will specifically work on: n/a   Recommended Physical Activity Goals Ana Johnson has been advised to work up to 150 minutes of moderate intensity aerobic activity a week and strengthening exercises 2-3 times per week for cardiovascular health, weight loss maintenance and preservation of muscle mass.   She has agreed to: maintain current level of activity.     Pharmacotherapy We both agreed to: Begin following nutritional and behavioral strategies   ASSOCIATED CONDITIONS ADDRESSED TODAY:  Fatigue Assessment & Plan: Ana Johnson does feel that her weight is causing her energy to be lower than it should be. Fatigue may be related to obesity, depression or many other causes. she does not appear to have any red flag symptoms and this appears to most likely be related to her current lifestyle habits and dietary intake.  Labs will be ordered and reviewed with her at their next office visit in two weeks.  Epworth sleepiness scale is 2 and appears to be within normal limits. Ana Johnson reports daytime somnolence and denies waking up still tired. Patient has a history of symptoms of daytime fatigue. Ana Johnson generally gets  6-8  hours of sleep per night, and states that she has generally restful sleep. Snoring is present. Apneic episodes are not present.   ECG: Performed and reviewed/ interpreted independently.  Normal sinus rhythm, rate 78 bpm; reassuring without any acute abnormalities, will continue to monitor for symptoms    Shortness of breath on exertion Assessment & Plan: Ana Johnson does feel that she gets out of breath more easily than she used to when she exercises and seems to be worsening over time with weight gain.  This has gotten worse recently. Ana Johnson denies shortness of breath at rest or orthopnea. Pt denies chest pain, dizziness, heart palpitations, or excessive diaphoresis or nausea with activity.  This is not new and is ongoing.  Ana Johnson's shortness of breath appears to be obesity related and exercise induced, as they do not appear to have any "red flag"  symptoms/ concerns today.  Also, this condition appears to be related to a state of poor cardiovascular conditioning   Obtain labs today and will be reviewed with her at their next office visit in two weeks.  Indirect Calorimeter completed today to help guide our dietary regimen. It shows a VO2  of 340 and a REE of 2347.  Her calculated basal metabolic rate is 4696 thus her resting energy expenditure is better than expected.  Patient agreed to work on weight loss at this time.  As Ana Johnson progresses through our weight loss program, we will gradually increase exercise as tolerated to treat her current condition.   If Ana Johnson follows our recommendations and loses 5-10% of their weight without improvement of her shortness of breath or if at any time, symptoms become more concerning, they agree to urgently follow up with their PCP/ specialist for further consideration/ evaluation.   Ana Johnson verbalizes agreement with this plan.    Depression Screen  Assessment & Plan: Her PHQ-9 score was negative at 0. Modified PHQ-9 score is 4. Pt endorses eating occasionally as a reward or when bored, but denies emotional eating being an issue. Encouraged pt to practice mindfulness eating to decrease overeating of foods.    Essential hypertension Assessment & Plan: BP Readings from Last 3 Encounters:  12/01/23 131/74  11/15/23 (!) 159/85  10/28/23 130/73  Ana Johnson is taking Diovan  80 mg daily. BP levels are slightly elevated today, but no concerns. Pt is asymptomatic. Condition moderately controlled. Continue current medication regimen. Engage in low sodium, heart healthy diet. Will continue to monitor alongside PCP.    Vitamin D  deficiency Assessment & Plan: Lab Results  Component Value Date   VD25OH 59 (H) 10/25/2020  Ana Johnson is taking Cholecalciferol 1000 units daily. Pt labs show low Vit D levels from 2016-2020, however most recent levels were elevated at 78. Will recheck levels today to obtain new results and avoid oversupplementation if levels continue to be elevated.   Relevant Orders:    B12 deficiency Assessment & Plan: Ana Johnson is on Cyanocobalamin  500 mcg daily. She does not recall when she was prescribed this supplement, or why. Educated pt on benefits of optimal B12 levels. Continue  current supplementation regimen. Will recheck levels today.   Relevant Orders:    Other hyperlipidemia Assessment & Plan: Ana Johnson has a h/o high lipid levels. She was never put on any medications for this condition. Currently diet/exercise controlled. I stressed the importance that patient continue with our prudent nutritional plan that is low in saturated and trans fats, and low in fatty carbs to improve these numbers. Last lipid panel was obtained two years ago, will obtain new results today.   Relevant Orders:    FOLLOW UP:   Follow up in 2 weeks. She was informed of the importance of frequent follow up visits to maximize her success with intensive lifestyle modifications for her multiple health conditions.  Ana Johnson is aware that we will review all of her lab results at our next visit.  She is aware that if anything is critical/ life threatening with the results, we will be contacting her via MyChart prior to the office visit to discuss management.    Chief Complaint:   OBESITY Ana Johnson (MR# [id number]) is a pleasant 53 y.o. female who presents for evaluation and treatment of obesity and related comorbidities. Current BMI is Body mass index is 54.32 kg/m. Ana Johnson has been struggling with her weight for many years and has been  unsuccessful in either losing weight, maintaining weight loss, or reaching her healthy weight goal.  Ana Johnson is currently in the action stage of change and ready to dedicate time achieving and maintaining a healthier weight. Ana Johnson is interested in becoming our patient and working on intensive lifestyle modifications including (but not limited to) diet and exercise for weight loss.  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 Patient is married to Ana Johnson  and has 1 adult child in college. She lives with her husband.   Walks on treadmill 30-40 minutes 2-3 times a week Gets 6000-10000 steps per  day Desires to be 199 to be healthier Gained weight from overeating and poor eating choices Tried weight loss medications in the past and weight watchers -- best was Saxinda, Wegovy , and weight watchers Eats out 2x/wk Does not crave any foods Snacks on fruit and popcorn usually after dinner Does not skip meals Drinks juice and tea with sugar, and occasionally a protein drink WFH: "Eating for taste"   Subjective:   This is the patient's first visit at [facility].  The patient's NEW PATIENT PACKET that they filled out prior to today's office visit was reviewed at length and information from that paperwork was included within the following office visit note.    Included in the packet: current and past health history, medications, allergies, ROS, gynecologic history (women only), surgical history, family history, social history, weight history, weight loss surgery history (for those that have had weight loss surgery), nutritional evaluation, mood and food questionnaire along with a depression screening (PHQ9) on all patients, an Epworth questionnaire, sleep habits questionnaire, patient life and health improvement goals questionnaire. These will all be scanned into the patient's chart under the "media" tab.   Review of Systems: Please refer to new patient packet scanned into media. Pertinent positives were addressed with patient today.  Reviewed by clinician on day of visit: allergies, medications, problem list, medical history, surgical history, family history, social history, and previous encounter notes.  During the visit, I independently reviewed the patient's EKG, bioimpedance scale results, and indirect calorimeter results. I used this information to tailor a meal plan for the patient that will help ANASIA Johnson to lose weight and will improve her obesity-related conditions going forward.  I performed a medically necessary appropriate examination and/or evaluation. I  discussed the assessment and treatment plan with the patient. The patient was provided an opportunity to ask questions and all were answered. The patient agreed with the plan and demonstrated an understanding of the instructions. Labs were ordered today (unless patient declined them) and will be reviewed with the patient at our next visit unless more critical results need to be addressed immediately. Clinical information was updated and documented in the EMR.    Objective:   PHYSICAL EXAM: Blood pressure 131/74, pulse 85, temperature 98.3 F (36.8 C), height 5\' 2"  (1.575 m), weight 297 lb (134.7 kg), SpO2 98%. Body mass index is 54.32 kg/m.  General: Well Developed, well nourished, and in no acute distress.  HEENT: Normocephalic, atraumatic; EOMI, sclerae are anicteric. Skin: Warm and dry, good turgor Chest:  Normal excursion, shape, no gross ABN Respiratory: No conversational dyspnea; speaking in full sentences NeuroM-Sk:  Normal gross ROM * 4 extremities  Psych: A and O *3, insight adequate, mood- full   Anthropometric Measurements Height: 5\' 2"  (1.575 m) Weight: 297 lb (134.7 kg) BMI (Calculated): 54.31 Weight at Last Visit: N/a Weight Lost Since  Last Visit: N/a Weight Gained Since Last Visit: N/a Starting Weight: 297lb Total Weight Loss (lbs): 0 lb (0 kg) Peak Weight: 312lb Waist Measurement : 53 inches   Body Composition  Body Fat %: 56.9 % Fat Mass (lbs): 169.4 lbs Muscle Mass (lbs): 121.8 lbs Visceral Fat Rating : 23   Other Clinical Data RMR: [id number] Fasting: Yes Labs: Yes Today's Visit #: 1 Starting Date: 12/01/23 Comments: First Visit    DIAGNOSTIC DATA REVIEWED:  BMET    Component Value Date/Time   NA 137 08/25/2023 0933   NA 141 08/27/2022 1338   K 4.0 08/25/2023 0933   CL 102 08/25/2023 0933   CO2 26 08/25/2023 0933   GLUCOSE 96 08/25/2023 0933   BUN 14 08/25/2023 0933   BUN 12 08/27/2022 1338   CREATININE 0.85 08/25/2023 0933   CALCIUM 9.9  08/25/2023 0933   GFRNONAA 85 06/14/2019 0000   GFRAA 98 06/14/2019 0000   Lab Results  Component Value Date   HGBA1C 5.3 08/27/2022   HGBA1C 5.4 05/18/2018   No results found for: "INSULIN" Lab Results  Component Value Date   TSH 1.700 06/19/2021   CBC    Component Value Date/Time   WBC 10.2 10/25/2020 1123   WBC 9.6 06/01/2016 0745   RBC 4.91 10/25/2020 1123   RBC 4.71 06/01/2016 0745   HGB 13.9 10/25/2020 1123   HCT 41.9 10/25/2020 1123   PLT 334 10/25/2020 1123   MCV 85 10/25/2020 1123   MCH 28.3 10/25/2020 1123   MCH 28.0 06/01/2016 0745   MCHC 33.2 10/25/2020 1123   MCHC 35.6 06/01/2016 0745   RDW 14.6 10/25/2020 1123   Iron Studies    Component Value Date/Time   FERRITIN 58 06/15/2019 1023   Lipid Panel     Component Value Date/Time   CHOL 226 (H) 08/25/2023 0933   CHOL 231 (H) 08/27/2022 1338   TRIG 140.0 08/25/2023 0933   HDL 60.10 08/25/2023 0933   HDL 54 08/27/2022 1338   CHOLHDL 4 08/25/2023 0933   VLDL 28.0 08/25/2023 0933   LDLCALC 137 (H) 08/25/2023 0933   LDLCALC 153 (H) 08/27/2022 1338   Hepatic Function Panel     Component Value Date/Time   PROT 8.2 08/25/2023 0933   PROT 7.5 08/27/2022 1338   ALBUMIN 4.6 08/25/2023 0933   ALBUMIN 4.7 08/27/2022 1338   AST 14 08/25/2023 0933   ALT 17 08/25/2023 0933   ALKPHOS 77 08/25/2023 0933   BILITOT 0.5 08/25/2023 0933   BILITOT 0.4 08/27/2022 1338      Component Value Date/Time   TSH 1.700 06/19/2021 1105   Nutritional Lab Results  Component Value Date   VD25OH 31.2 06/14/2019   VD25OH 17.4 (L) 05/18/2018   VD25OH 22.7 (L) 05/17/2015    Attestation Statements:   I, Camryn Mix, acting as a Stage manager for [employer], DO., have compiled all relevant documentation for today's office visit on behalf of Marceil Sensor, DO, while in the presence of [employer], DO.  Reviewed by clinician on day of visit: allergies, medications, problem list, medical history, surgical history,  family history, social history, and previous encounter notes pertinent to patient's obesity diagnosis. I have spent 52 minutes in the care of the patient today including: I have spent *** minutes in the care of the patient today: Specifically- *** minutes was spent before the visit reviewing the chart. *** minutes was spent counseling patient on the disease of obesity, nutrition, and what our program can  do for their medical conditions as well as in preventing future diseases. I discussed the importance of comprehensive care in the treatment of obesity including mental well being and physical activity. This was all in addition to the above counseling. *** minutes was spent after the visit on additional documentation and chart review.  preparing to see patient (e.g. review and interpretation of tests, old notes ), obtaining and/or reviewing separately obtained history, performing a medically appropriate examination or evaluation, counseling and educating the patient, ordering medications, test or procedures, documenting clinical information in the electronic or other health care record, and independently interpreting results and communicating results to the patient, family, or caregiver   I have reviewed the above documentation for accuracy and completeness, and I agree with the above. Ana Johnson, D.O.  The 21st Century Cures Act was signed into law in 2016 which includes the topic of electronic health records.  This provides immediate access to information in MyChart.  This includes consultation notes, operative notes, office notes, lab results and pathology reports.  If you have any questions about what you read please let us  know at your next visit so we can discuss your concerns and take corrective action if need be.  We are right here with you.

## 2023-12-04 LAB — CBC WITH DIFFERENTIAL/PLATELET
Basophils Absolute: 0.1 10*3/uL (ref 0.0–0.2)
Basos: 1 %
EOS (ABSOLUTE): 0.4 10*3/uL (ref 0.0–0.4)
Eos: 5 %
Hematocrit: 39.6 % (ref 34.0–46.6)
Hemoglobin: 13.1 g/dL (ref 11.1–15.9)
Immature Grans (Abs): 0 10*3/uL (ref 0.0–0.1)
Immature Granulocytes: 0 %
Lymphocytes Absolute: 2.7 10*3/uL (ref 0.7–3.1)
Lymphs: 31 %
MCH: 29.2 pg (ref 26.6–33.0)
MCHC: 33.1 g/dL (ref 31.5–35.7)
MCV: 88 fL (ref 79–97)
Monocytes Absolute: 0.6 10*3/uL (ref 0.1–0.9)
Monocytes: 7 %
Neutrophils Absolute: 4.9 10*3/uL (ref 1.4–7.0)
Neutrophils: 56 %
Platelets: 286 10*3/uL (ref 150–450)
RBC: 4.48 x10E6/uL (ref 3.77–5.28)
RDW: 14.1 % (ref 11.7–15.4)
WBC: 8.6 10*3/uL (ref 3.4–10.8)

## 2023-12-04 LAB — COMPREHENSIVE METABOLIC PANEL WITH GFR
ALT: 12 IU/L (ref 0–32)
AST: 13 IU/L (ref 0–40)
Albumin: 4.1 g/dL (ref 3.8–4.9)
Alkaline Phosphatase: 76 IU/L (ref 44–121)
BUN/Creatinine Ratio: 13 (ref 9–23)
BUN: 10 mg/dL (ref 6–24)
Bilirubin Total: 0.5 mg/dL (ref 0.0–1.2)
CO2: 20 mmol/L (ref 20–29)
Calcium: 9.3 mg/dL (ref 8.7–10.2)
Chloride: 105 mmol/L (ref 96–106)
Creatinine, Ser: 0.75 mg/dL (ref 0.57–1.00)
Globulin, Total: 2.7 g/dL (ref 1.5–4.5)
Glucose: 88 mg/dL (ref 70–99)
Potassium: 4.4 mmol/L (ref 3.5–5.2)
Sodium: 140 mmol/L (ref 134–144)
Total Protein: 6.8 g/dL (ref 6.0–8.5)
eGFR: 96 mL/min/{1.73_m2} (ref >59)

## 2023-12-04 LAB — VITAMIN B12: Vitamin B-12: 389 pg/mL (ref 232–1245)

## 2023-12-04 LAB — LIPID PANEL
Chol/HDL Ratio: 3.4 ratio (ref 0.0–4.4)
Cholesterol, Total: 201 mg/dL — ABNORMAL HIGH (ref 100–199)
HDL: 59 mg/dL (ref >39)
LDL Chol Calc (NIH): 119 mg/dL — ABNORMAL HIGH (ref 0–99)
Triglycerides: 133 mg/dL (ref 0–149)
VLDL Cholesterol Cal: 23 mg/dL (ref 5–40)

## 2023-12-04 LAB — HEMOGLOBIN A1C
Est. average glucose Bld gHb Est-mCnc: 111 mg/dL
Hgb A1c MFr Bld: 5.5 % (ref 4.8–5.6)

## 2023-12-04 LAB — INSULIN, RANDOM: INSULIN: 13.1 u[IU]/mL (ref 2.6–24.9)

## 2023-12-04 LAB — T4, FREE: Free T4: 1.14 ng/dL (ref 0.82–1.77)

## 2023-12-04 LAB — VITAMIN D 25 HYDROXY (VIT D DEFICIENCY, FRACTURES): Vit D, 25-Hydroxy: 22.8 ng/mL — ABNORMAL LOW (ref 30.0–100.0)

## 2023-12-04 LAB — T3: T3, Total: 115 ng/dL (ref 71–180)

## 2023-12-04 LAB — TSH: TSH: 1.66 u[IU]/mL (ref 0.450–4.500)

## 2023-12-04 LAB — FOLATE: Folate: 8 ng/mL (ref >3.0)

## 2023-12-05 ENCOUNTER — Other Ambulatory Visit: Payer: Self-pay | Admitting: Nurse Practitioner

## 2023-12-05 DIAGNOSIS — I10 Essential (primary) hypertension: Secondary | ICD-10-CM

## 2023-12-15 ENCOUNTER — Encounter (INDEPENDENT_AMBULATORY_CARE_PROVIDER_SITE_OTHER): Payer: Self-pay | Admitting: Family Medicine

## 2023-12-15 ENCOUNTER — Ambulatory Visit (INDEPENDENT_AMBULATORY_CARE_PROVIDER_SITE_OTHER): Admitting: Family Medicine

## 2023-12-15 VITALS — BP 124/78 | HR 82 | Temp 98.4°F | Ht 62.0 in | Wt 283.0 lb

## 2023-12-15 DIAGNOSIS — Z6841 Body Mass Index (BMI) 40.0 and over, adult: Secondary | ICD-10-CM

## 2023-12-15 DIAGNOSIS — E559 Vitamin D deficiency, unspecified: Secondary | ICD-10-CM

## 2023-12-15 DIAGNOSIS — I10 Essential (primary) hypertension: Secondary | ICD-10-CM

## 2023-12-15 DIAGNOSIS — E782 Mixed hyperlipidemia: Secondary | ICD-10-CM | POA: Diagnosis not present

## 2023-12-15 DIAGNOSIS — E88819 Insulin resistance, unspecified: Secondary | ICD-10-CM | POA: Diagnosis not present

## 2023-12-15 DIAGNOSIS — E538 Deficiency of other specified B group vitamins: Secondary | ICD-10-CM

## 2023-12-15 MED ORDER — VITAMIN D (ERGOCALCIFEROL) 1.25 MG (50000 UNIT) PO CAPS
50000.0000 [IU] | ORAL_CAPSULE | ORAL | 1 refills | Status: DC
Start: 2023-12-15 — End: 2024-02-03

## 2023-12-15 MED ORDER — CYANOCOBALAMIN 500 MCG PO TABS: 1000.0000 ug | ORAL_TABLET | Freq: Every day | ORAL | Status: AC

## 2023-12-15 NOTE — Patient Instructions (Signed)
 The 10-year ASCVD risk score (Arnett DK, et al., 2019) is: 3%   Values used to calculate the score:     Age: 53 years     Sex: Female     Is Non-Hispanic African American: Yes     Diabetic: No     Tobacco smoker: No     Systolic Blood Pressure: 124 mmHg     Is BP treated: Yes     HDL Cholesterol: 59 mg/dL     Total Cholesterol: 201 mg/dL

## 2023-12-15 NOTE — Progress Notes (Signed)
 Ana Johnson, D.O.  ABFM, ABOM Clinical Bariatric Medicine Physician  Office located at: [address] [place], [place]  [address]      Assessment and Plan:   Medications Discontinued During This Encounter  Medication Reason   cholecalciferol (VITAMIN D3) 25 MCG (1000 UT) tablet Dose change   vitamin B-12 (CYANOCOBALAMIN ) 500 MCG tablet Reorder     Meds ordered this encounter  Medications   cyanocobalamin  (VITAMIN B12) 500 MCG tablet    Sig: Take 2 tablets (1,000 mcg total) by mouth daily.   Vitamin D , Ergocalciferol , (DRISDOL ) 1.25 MG (50000 UNIT) CAPS capsule    Sig: Take 1 capsule (50,000 Units total) by mouth every 7 (seven) days.    Dispense:  4 capsule    Refill:  1     FOR THE DISEASE OF OBESITY:  BMI 50.0-59.9, adult (HCC) - Current BMI 51.75 Morbid obesity (HCC) - START BMI 54.31 Assessment & Plan: Since last office visit on 12/01/2023 patient's  Muscle mass has increased by 11 lb. Fat mass has decreased by 25.8 lb. Total body water is 96.2 lbs. Counseling done on how various foods will affect these numbers and how to maximize success  Total lbs lost to date: 14 lbs  Total weight loss percentage to date: 4.71%    Recommended Dietary Goals Ana Johnson is currently in the action stage of change. As such, her goal is to continue weight management plan.  Meal Plan: Pt was given CAT 3 B & L options and ADD a 100-200 calorie of a 10:1 ratio food item.    Behavioral Intervention We discussed the following today: high protein cereal options, increasing water intake   Additional resources provided today: Handout on CAT 3 breakfast options, Handout on CAT 3 lunch options, and Handout on insulin  resistance education  Evidence-based interventions for health behavior change were utilized today including the discussion of self monitoring techniques, problem-solving barriers and SMART goal setting techniques.  Regarding patient's less desirable eating habits and  patterns, we employed the technique of small changes.   Pt will specifically work on: n/a   Recommended Physical Activity Goals Ana Johnson has been advised to work up to 150 minutes of moderate intensity aerobic activity a week and strengthening exercises 2-3 times per week for cardiovascular health, weight loss maintenance and preservation of muscle mass.   She has agreed to continue current regimen   Pharmacotherapy We both agreed to continue with nutritional and behavioral strategies     ASSOCIATED CONDITIONS ADDRESSED TODAY:  Insulin  resistance - new onset Assessment & Plan: Most recent labs: Lab Results  Component Value Date   HGBA1C 5.5 12/01/2023   HGBA1C 5.3 08/27/2022   HGBA1C 5.2 03/22/2022   INSULIN  13.1 12/01/2023    Lab Results  Component Value Date   CREATININE 0.75 12/01/2023   BUN 10 12/01/2023   NA 140 12/01/2023   K 4.4 12/01/2023   CL 105 12/01/2023   CO2 20 12/01/2023      Component Value Date/Time   PROT 6.8 12/01/2023 1015   ALBUMIN 4.1 12/01/2023 1015   AST 13 12/01/2023 1015   ALT 12 12/01/2023 1015   ALKPHOS 76 12/01/2023 1015   BILITOT 0.5 12/01/2023 1015    Lab Results  Component Value Date   WBC 8.6 12/01/2023   HGB 13.1 12/01/2023   HCT 39.6 12/01/2023   MCV 88 12/01/2023   PLT 286 12/01/2023   Lab Results  Component Value Date   TSH 1.660 12/01/2023  FREET4 1.14 12/01/2023   T3TOTAL 115 12/01/2023    Diet/lifestyle approach currently.   Reviewed labs above extensively with pt: Her A1c is at goal. Fasting insulin  is roughly 2.5 times normal; eventual goal <5. Kidney function, electrolytes, liver enzymes, folic acid, blood counts, and thyroid  levels are within acceptable ranges.   Educational handout on I.R provided. Patient aware of disease state and risk of progression. Explained role of simple carbs and insulin  levels on hunger and cravings. Educated patient that having adequate amounts of protein with each meal is  important for increasing muscle mass, stabilizing sugars, controlling hunger and cravings, and improving thermogenesis.   Continue working on nutrition plan to decrease simple carbohydrates, increase lean proteins and exercise to promote weight loss and prevent progression to Pre-DM. Losing 5-10% or more of body weight may improve condition.    Essential hypertension Assessment & Plan: Last 3 blood pressure readings in our office are as follows: BP Readings from Last 3 Encounters:  12/15/23 124/78  12/01/23 131/74  11/15/23 (!) 159/85   The 10-year ASCVD risk score (Arnett DK, et al., 2019) is: 3%  Lab Results  Component Value Date   CREATININE 0.75 12/01/2023   Blood pressure is controlled on Diovan  80 mg daily. Continue medication and low salt diet. Losing 7-10% of body weight may improve blood pressure control.    Mixed hyperlipidemia Assessment & Plan: Most recent labs:  Lab Results  Component Value Date   CHOL 201 (H) 12/01/2023   HDL 59 12/01/2023   LDLCALC 119 (H) 12/01/2023   TRIG 133 12/01/2023   CHOLHDL 3.4 12/01/2023   The 10-year ASCVD risk score (Arnett DK, et al., 2019) is: 3%   Values used to calculate the score:     Age: 53 years     Sex: Female     Is Non-Hispanic African American: Yes     Diabetic: No     Tobacco smoker: No     Systolic Blood Pressure: 124 mmHg     Is BP treated: Yes     HDL Cholesterol: 59 mg/dL     Total Cholesterol: 201 mg/dL  Diet/lifestyle approach. No need for statin at this time  H/o elevated TRIG and LDL. Most recent lipid panel WNL w/exception of LDL. Recommended LDL goal is <100 to reduce the risk of fatty streaks and the progression to obstructive ASCVD in the future.   Continue to work on nutrition plan -decreasing simple carbohydrates, increasing lean proteins, decreasing saturated fats and cholesterol , avoiding trans fats and exercise as able to promote weight loss, improve lipids and decrease cardiovascular risks.      Vitamin D  deficiency Assessment & Plan: Most recent VD:  Lab Results  Component Value Date   VD25OH 22.8 (L) 12/01/2023   VD25OH 31.2 06/14/2019   VD25OH 17.4 (L) 05/18/2018   Is currently taking OTC VD 1,000 units daily.  Her Vitamin D  levels are not at goal of 50 to 70. Low vitamin D  levels can be associated with adiposity and may result in leptin resistance and weight gain. Also associated with fatigue. Pt has been instructed to STOP her OTC VD and START ergocalciferol  50,000 units wkly. Recheck in 3-4 mos.     B12 deficiency Assessment & Plan: Most recent labs:  Lab Results  Component Value Date   VITAMINB12 389 12/01/2023   FOLATE 8.0 12/01/2023    Is currently taking OTC B12 500 mcg daily.   Folate is normal. B12 level is 389, not  at goal of over 500.  I discussed the importance of vitamin B12 to the patient's health. Pt instructed to INCREASE her OTC B12 to 500 mcg twice daily. Recheck in 3-4 mos    FOLLOW UP:   Return 01/13/2024 at 11:20 AM. She was informed of the importance of frequent follow up visits to maximize her success with intensive lifestyle modifications for her multiple health conditions.    Subjective:   Chief complaint: Obesity Ana Johnson is here to discuss her progress with her obesity treatment plan. She is on the Category 3 Plan and states she is following her eating plan approximately 80-90 % of the time. She states she is not exercising.  Interval History:  Ana Johnson is here today for her first follow-up office visit since starting the program with us . Patient is off to an excellent start and has lost 15 lbs. States she is mostly adhering to her meal plan  and likes the foods so far. Went out to eat on a few occasions (e.g Mothers day and a graduation event) and practiced mindful eating. Snacks on apples or popcorn. Stopped drinking caloric beverages. Has good control of hunger and cravings. Sleeps 7-9 hrs.   Pharmacotherapy for weight  loss: none   Review of Systems:  Pertinent positives were addressed with patient today.   Reviewed by clinician on day of visit: allergies, medications, problem list, medical history, surgical history, family history, social history, and previous encounter notes.     Weight Summary and Biometrics   Weight Lost Since Last Visit: 14lb  Weight Gained Since Last Visit: 0   Vitals Temp: 98.4 F (36.9 C) BP: 124/78 Pulse Rate: 82 SpO2: 96 %   Anthropometric Measurements Height: 5\' 2"  (1.575 m) Weight: 283 lb (128.4 kg) BMI (Calculated): 51.75 Weight at Last Visit: 297lb Weight Lost Since Last Visit: 14lb Weight Gained Since Last Visit: 0 Starting Weight: 297lb Total Weight Loss (lbs): 14 lb (6.35 kg) Peak Weight: 312lb   Body Composition  Body Fat %: 50.7 % Fat Mass (lbs): 143.6 lbs Muscle Mass (lbs): 132.8 lbs Total Body Water (lbs): 96.2 lbs Visceral Fat Rating : 19   Other Clinical Data Fasting: yes Labs: no Today's Visit #: 2 Starting Date: 12/01/23   Objective:   PHYSICAL EXAM:  Blood pressure 124/78, pulse 82, temperature 98.4 F (36.9 C), height 5\' 2"  (1.575 m), weight 283 lb (128.4 kg), SpO2 96%. Body mass index is 51.76 kg/m.  General: she is overweight, cooperative and in no acute distress.   HEENT: EOMI, sclerae are anicteric. Lungs: Normal breathing effort, no conversational dyspnea. M-Sk:  Normal gross ROM * 4 extremities  PSYCH: Has normal mood, affect and thought process. Neurologic: No gross sensory or motor deficits. Well developed, A and O * 3  DIAGNOSTIC DATA REVIEWED:  BMET    Component Value Date/Time   NA 140 12/01/2023 1015   K 4.4 12/01/2023 1015   CL 105 12/01/2023 1015   CO2 20 12/01/2023 1015   GLUCOSE 88 12/01/2023 1015   GLUCOSE 96 08/25/2023 0933   BUN 10 12/01/2023 1015   CREATININE 0.75 12/01/2023 1015   CALCIUM 9.3 12/01/2023 1015   GFRNONAA 85 06/14/2019 0000   GFRAA 98 06/14/2019 0000   Lab Results   Component Value Date   HGBA1C 5.5 12/01/2023   HGBA1C 5.4 05/18/2018   Lab Results  Component Value Date   INSULIN  13.1 12/01/2023   Lab Results  Component Value Date   TSH 1.660 12/01/2023  CBC    Component Value Date/Time   WBC 8.6 12/01/2023 1015   WBC 9.6 06/01/2016 0745   RBC 4.48 12/01/2023 1015   RBC 4.71 06/01/2016 0745   HGB 13.1 12/01/2023 1015   HCT 39.6 12/01/2023 1015   PLT 286 12/01/2023 1015   MCV 88 12/01/2023 1015   MCH 29.2 12/01/2023 1015   MCH 28.0 06/01/2016 0745   MCHC 33.1 12/01/2023 1015   MCHC 35.6 06/01/2016 0745   RDW 14.1 12/01/2023 1015   Iron Studies    Component Value Date/Time   FERRITIN 58 06/15/2019 1023   Lipid Panel     Component Value Date/Time   CHOL 201 (H) 12/01/2023 1015   TRIG 133 12/01/2023 1015   HDL 59 12/01/2023 1015   CHOLHDL 3.4 12/01/2023 1015   CHOLHDL 4 08/25/2023 0933   VLDL 28.0 08/25/2023 0933   LDLCALC 119 (H) 12/01/2023 1015   Hepatic Function Panel     Component Value Date/Time   PROT 6.8 12/01/2023 1015   ALBUMIN 4.1 12/01/2023 1015   AST 13 12/01/2023 1015   ALT 12 12/01/2023 1015   ALKPHOS 76 12/01/2023 1015   BILITOT 0.5 12/01/2023 1015      Component Value Date/Time   TSH 1.660 12/01/2023 1015   Nutritional Lab Results  Component Value Date   VD25OH 22.8 (L) 12/01/2023   VD25OH 31.2 06/14/2019   VD25OH 17.4 (L) 05/18/2018    Attestations:   I, Special Puri, acting as a Stage manager for Marceil Sensor, DO., have compiled all relevant documentation for today's office visit on behalf of Marceil Sensor, DO, while in the presence of [employer], DO.  I have reviewed the above documentation for accuracy and completeness, and I agree with the above. Ana Johnson, D.O.  The 21st Century Cures Act was signed into law in 2016 which includes the topic of electronic health records.  This provides immediate access to information in MyChart.  This includes consultation notes,  operative notes, office notes, lab results and pathology reports.  If you have any questions about what you read please let us  know at your next visit so we can discuss your concerns and take corrective action if need be.  We are right here with you.

## 2024-01-02 ENCOUNTER — Ambulatory Visit: Admitting: Nurse Practitioner

## 2024-01-05 ENCOUNTER — Other Ambulatory Visit (INDEPENDENT_AMBULATORY_CARE_PROVIDER_SITE_OTHER): Payer: Self-pay | Admitting: Family Medicine

## 2024-01-05 DIAGNOSIS — E559 Vitamin D deficiency, unspecified: Secondary | ICD-10-CM

## 2024-01-13 ENCOUNTER — Ambulatory Visit (INDEPENDENT_AMBULATORY_CARE_PROVIDER_SITE_OTHER): Admitting: Family Medicine

## 2024-02-03 ENCOUNTER — Ambulatory Visit (INDEPENDENT_AMBULATORY_CARE_PROVIDER_SITE_OTHER): Admitting: Family Medicine

## 2024-02-03 ENCOUNTER — Encounter (INDEPENDENT_AMBULATORY_CARE_PROVIDER_SITE_OTHER): Payer: Self-pay | Admitting: Family Medicine

## 2024-02-03 VITALS — BP 120/70 | HR 96 | Temp 97.8°F | Ht 62.0 in | Wt 288.0 lb

## 2024-02-03 DIAGNOSIS — E88819 Insulin resistance, unspecified: Secondary | ICD-10-CM

## 2024-02-03 DIAGNOSIS — I10 Essential (primary) hypertension: Secondary | ICD-10-CM

## 2024-02-03 DIAGNOSIS — E559 Vitamin D deficiency, unspecified: Secondary | ICD-10-CM

## 2024-02-03 DIAGNOSIS — E65 Localized adiposity: Secondary | ICD-10-CM

## 2024-02-03 DIAGNOSIS — Z6841 Body Mass Index (BMI) 40.0 and over, adult: Secondary | ICD-10-CM

## 2024-02-03 MED ORDER — VITAMIN D (ERGOCALCIFEROL) 1.25 MG (50000 UNIT) PO CAPS: 50000.0000 [IU] | ORAL_CAPSULE | ORAL | 0 refills | Status: DC

## 2024-02-03 NOTE — Progress Notes (Signed)
 Ana Johnson, D.O.  ABFM, ABOM Specializing in Clinical Bariatric Medicine  Office located at: [address] [place], [place]  [address]   Assessment and Plan:   Medications Discontinued During This Encounter  Medication Reason   Vitamin D , Ergocalciferol , (DRISDOL ) 1.25 MG (50000 UNIT) CAPS capsule Reorder     Meds ordered this encounter  Medications   Vitamin D , Ergocalciferol , (DRISDOL ) 1.25 MG (50000 UNIT) CAPS capsule    Sig: Take 1 capsule (50,000 Units total) by mouth every 7 (seven) days.    Dispense:  4 capsule    Refill:  0     FOR THE DISEASE OF OBESITY:  BMI 50.0-59.9, adult (HCC) - Current BMI 52.68 Morbid obesity (HCC) - START BMI 54.31 Assessment & Plan: Since last office visit on 12/15/2023 patient's muscle mass has decreased by 4.8 lbs. Fat mass has increased by 10.2 lbs. Total body water has increased by 1.2 lbs. Body fat percentage increased by 2.6%. Counseling done on how various foods will affect these numbers and how to maximize success  Total lbs lost to date: - 9 lbs  Total weight loss percentage to date: - 3.03%    Recommended Dietary Goals [name] is currently in the action stage of change. As such, her goal is to continue weight management plan.  She has agreed to: continue current plan   Behavioral Intervention We discussed the following today: increasing lean protein intake to established goals, decreasing simple carbohydrates , continue to work on implementation of reduced calorie nutritional plan, continue to practice mindfulness when eating, and focusing on food with a 10:1 ratio of calories: grams of protein  Additional resources provided today: Eating out guide given and provided patient with personalized instruction on finding healthier options when eating out.    Evidence-based interventions for health behavior change were utilized today including the discussion of self monitoring techniques, problem-solving barriers and SMART  goal setting techniques.   Regarding patient's less desirable eating habits and patterns, we employed the technique of small changes.   Pt will specifically work on making healthier choices when eating out.    Recommended Physical Activity Goals [name] has been advised to work up to 300-450 minutes of moderate intensity aerobic activity a week and strengthening exercises 2-3 times per week for cardiovascular health, weight loss maintenance and preservation of muscle mass.   She has agreed to: continue to gradually increase the amount and intensity of exercise routine   Pharmacotherapy Continue with current nutritional and behavioral strategies   ASSOCIATED CONDITIONS ADDRESSED TODAY:   Insulin  resistance Assessment & Plan: Lab Results  Component Value Date   HGBA1C 5.5 12/01/2023   HGBA1C 5.3 08/27/2022   HGBA1C 5.2 03/22/2022   INSULIN  13.1 12/01/2023    Managed with diet and life style interventions. Hunger and cravings are stable. No acute concerns.   Again explained role of simple carbs and insulin  levels on hunger and cravings. Continue working on nutrition plan to decrease simple carbohydrates, increase lean proteins and exercise to promote weight loss and improve glycemic control and prevent progression to Pre-DM.     Essential hypertension Assessment & Plan: Last 3 blood pressure readings in our office are as follows: BP Readings from Last 3 Encounters:  02/03/24 120/70  12/15/23 124/78  12/01/23 131/74   The 10-year ASCVD risk score (Arnett DK, et al., 2019) is: 2.6%  Lab Results  Component Value Date   CREATININE 0.75 12/01/2023   Blood pressure is controlled on Valsartan  80  mg daily. Continue to work on nutrition plan to promote weight loss. Advance exercise as able.     Visceral obesity Assessment & Plan: Visceral fat rating worsened from 19 to 21 since LOV.  The visceral fat rating should be < 12 in a female.    Visceral adipose tissue is a  hormonally active component of total body fat. This body composition phenotype is associated with medical disorders such as metabolic syndrome, cardiovascular disease and several malignancies including prostate, breast, and colorectal cancers.  Goal: Lose 7-10% of weight via prudent nutritional plan and lifestyle changes.      Vitamin D  deficiency Assessment & Plan: Lab Results  Component Value Date   VD25OH 22.8 (L) 12/01/2023   VD25OH 31.2 06/14/2019   VD25OH 17.4 (L) 05/18/2018   Pt is currently on ergocalciferol  50,000 units weekly without adverse SE. Continue supplement. Recheck periodically.    Follow up:   Return 03/03/2024 at 8:40 AM. She was informed of the importance of frequent follow up visits to maximize her success with intensive lifestyle modifications for her multiple health conditions.   Subjective:   Chief complaint: Obesity Ana Johnson is here to discuss her progress with her obesity treatment plan. She is on the Category 3 Plan with B & L options and an additional 100-200 calorie of a 10:1 ratio food item and states she is following her eating plan approximately 50% of the time. She states she is walking 45 minutes 3 days per week.  Interval History:  [name] is here for a follow up office visit. Since last OV on 12/15/2023 , she is up 5 lbs. Traveled multiple times since LOV and acknowledges not doing well food wise and eating out more often. When at home and eating on plan, she gets in 8 ounces of lean protein for dinner.    Pharmacotherapy that aid with weight loss:  none   Review of Systems:  Pertinent positives were addressed with patient today.  Reviewed by clinician on day of visit: allergies, medications, problem list, medical history, surgical history, family history, social history, and previous encounter notes.  Weight Summary and Biometrics   Weight Lost Since Last Visit: 0lb  Weight Gained Since Last Visit: 5lb   Vitals Temp: 97.8 F  (36.6 C) BP: 120/70 Pulse Rate: 96 SpO2: 98 %   Anthropometric Measurements Height: 5' 2 (1.575 m) Weight: 288 lb (130.6 kg) BMI (Calculated): 52.66 Weight at Last Visit: 283lb Weight Lost Since Last Visit: 0lb Weight Gained Since Last Visit: 5lb Starting Weight: 297lb Total Weight Loss (lbs): 9 lb (4.082 kg) Peak Weight: 312lb   Body Composition  Body Fat %: 53.3 % Fat Mass (lbs): 153.8 lbs Muscle Mass (lbs): 128 lbs Total Body Water (lbs): 97.4 lbs Visceral Fat Rating : 21   Other Clinical Data Fasting: No Labs: 'No Today's Visit #: 3 Starting Date: 12/01/23    Objective:   PHYSICAL EXAM: Blood pressure 120/70, pulse 96, temperature 97.8 F (36.6 C), height 5' 2 (1.575 m), weight 288 lb (130.6 kg), SpO2 98%. Body mass index is 52.68 kg/m.  General: she is overweight, cooperative and in no acute distress. PSYCH: Has normal mood, affect and thought process.   HEENT: EOMI, sclerae are anicteric. Lungs: Normal breathing effort, no conversational dyspnea. Extremities: Moves * 4 Neurologic: A and O * 3, good insight  DIAGNOSTIC DATA REVIEWED: BMET    Component Value Date/Time   NA 140 12/01/2023 1015   K 4.4 12/01/2023 1015  CL 105 12/01/2023 1015   CO2 20 12/01/2023 1015   GLUCOSE 88 12/01/2023 1015   GLUCOSE 96 08/25/2023 0933   BUN 10 12/01/2023 1015   CREATININE 0.75 12/01/2023 1015   CALCIUM 9.3 12/01/2023 1015   GFRNONAA 85 06/14/2019 0000   GFRAA 98 06/14/2019 0000   Lab Results  Component Value Date   HGBA1C 5.5 12/01/2023   HGBA1C 5.4 05/18/2018   Lab Results  Component Value Date   INSULIN  13.1 12/01/2023   Lab Results  Component Value Date   TSH 1.660 12/01/2023   CBC    Component Value Date/Time   WBC 8.6 12/01/2023 1015   WBC 9.6 06/01/2016 0745   RBC 4.48 12/01/2023 1015   RBC 4.71 06/01/2016 0745   HGB 13.1 12/01/2023 1015   HCT 39.6 12/01/2023 1015   PLT 286 12/01/2023 1015   MCV 88 12/01/2023 1015   MCH 29.2  12/01/2023 1015   MCH 28.0 06/01/2016 0745   MCHC 33.1 12/01/2023 1015   MCHC 35.6 06/01/2016 0745   RDW 14.1 12/01/2023 1015   Iron Studies    Component Value Date/Time   FERRITIN 58 06/15/2019 1023   Lipid Panel     Component Value Date/Time   CHOL 201 (H) 12/01/2023 1015   TRIG 133 12/01/2023 1015   HDL 59 12/01/2023 1015   CHOLHDL 3.4 12/01/2023 1015   CHOLHDL 4 08/25/2023 0933   VLDL 28.0 08/25/2023 0933   LDLCALC 119 (H) 12/01/2023 1015   Hepatic Function Panel     Component Value Date/Time   PROT 6.8 12/01/2023 1015   ALBUMIN 4.1 12/01/2023 1015   AST 13 12/01/2023 1015   ALT 12 12/01/2023 1015   ALKPHOS 76 12/01/2023 1015   BILITOT 0.5 12/01/2023 1015      Component Value Date/Time   TSH 1.660 12/01/2023 1015   Nutritional Lab Results  Component Value Date   VD25OH 22.8 (L) 12/01/2023   VD25OH 31.2 06/14/2019   VD25OH 17.4 (L) 05/18/2018    Attestations:   I, Special Puri, acting as a Stage manager for Ana Jenkins, DO., have compiled all relevant documentation for today's office visit on behalf of Ana Jenkins, DO, while in the presence of [employer], DO.  I have reviewed the above documentation for accuracy and completeness, and I agree with the above. Ana JINNY Johnson, D.O.  The 21st Century Cures Act was signed into law in 2016 which includes the topic of electronic health records.  This provides immediate access to information in MyChart.  This includes consultation notes, operative notes, office notes, lab results and pathology reports.  If you have any questions about what you read please let us  know at your next visit so we can discuss your concerns and take corrective action if need be.  We are right here with you.

## 2024-03-03 ENCOUNTER — Ambulatory Visit (INDEPENDENT_AMBULATORY_CARE_PROVIDER_SITE_OTHER): Admitting: Family Medicine

## 2024-03-23 ENCOUNTER — Ambulatory Visit (INDEPENDENT_AMBULATORY_CARE_PROVIDER_SITE_OTHER): Admitting: Family Medicine

## 2024-03-25 ENCOUNTER — Ambulatory Visit (INDEPENDENT_AMBULATORY_CARE_PROVIDER_SITE_OTHER): Admitting: Family Medicine

## 2024-03-31 ENCOUNTER — Ambulatory Visit: Admitting: Nurse Practitioner

## 2024-04-15 ENCOUNTER — Encounter (INDEPENDENT_AMBULATORY_CARE_PROVIDER_SITE_OTHER): Payer: Self-pay | Admitting: Family Medicine

## 2024-04-15 ENCOUNTER — Ambulatory Visit (INDEPENDENT_AMBULATORY_CARE_PROVIDER_SITE_OTHER): Admitting: Family Medicine

## 2024-04-15 DIAGNOSIS — E559 Vitamin D deficiency, unspecified: Secondary | ICD-10-CM

## 2024-04-15 DIAGNOSIS — E88819 Insulin resistance, unspecified: Secondary | ICD-10-CM

## 2024-04-15 DIAGNOSIS — F430 Acute stress reaction: Secondary | ICD-10-CM

## 2024-04-15 DIAGNOSIS — Z6841 Body Mass Index (BMI) 40.0 and over, adult: Secondary | ICD-10-CM

## 2024-04-15 DIAGNOSIS — I10 Essential (primary) hypertension: Secondary | ICD-10-CM

## 2024-04-15 MED ORDER — VITAMIN D (ERGOCALCIFEROL) 1.25 MG (50000 UNIT) PO CAPS: 50000.0000 [IU] | ORAL_CAPSULE | ORAL | 1 refills | Status: DC

## 2024-04-15 NOTE — Progress Notes (Signed)
 Ana Johnson, D.O.  ABFM, ABOM Specializing in Clinical Bariatric Medicine  Office located at: [address] [place], [place]  [address]   Assessment and Plan:   Medications Discontinued During This Encounter  Medication Reason   Vitamin D , Ergocalciferol , (DRISDOL ) 1.25 MG (50000 UNIT) CAPS capsule Reorder     Meds ordered this encounter  Medications   Vitamin D , Ergocalciferol , (DRISDOL ) 1.25 MG (50000 UNIT) CAPS capsule    Sig: Take 1 capsule (50,000 Units total) by mouth every 7 (seven) days.    Dispense:  4 capsule    Refill:  1      FOR THE DISEASE OF OBESITY:  Morbid obesity (HCC) - START BMI 54.31 BMI 50.0-59.9, adult (HCC) - Current BMI 52.3 Assessment & Plan: Since last office visit on 02/03/24 patient's muscle mass has increased by 4 lbs. Fat mass has decreased by 6.4 lbs. Total body water has decreased by 3.8 lbs.  Body fat % has decreased by 1.8 %. Counseling done on how various foods will affect these numbers and how to maximize success  Total lbs lost to date: - 11 lbs Total weight loss percentage to date: - 3.70 %  - Tracking Calories/Macros: yes  - Eating More Whole Foods: yes  - Adequate Protein Intake: yes  - Adequate Water Intake: yes  - Skipping Meals: no   - Sleeping 7-9 Hours/ Night: yes   Recommended Dietary Goals Ana Johnson is currently in the action stage of change. As such, her goal is to continue weight management plan.  She has agreed to: Category 3 Plan with B & L options and an additional 100-200 calorie of a 10:1 ratio food item    Behavioral Intervention We discussed the following today: keeping healthy foods at home, decreasing eating out or consumption of processed foods, and making healthy choices when eating convenient foods, work on managing stress, creating time for self-care and relaxation, and better snacking choices,meditation (calm app)   Additional resources provided today: None  Evidence-based interventions  for health behavior change were utilized today including the discussion of self monitoring techniques, problem-solving barriers and SMART goal setting techniques.   Regarding patient's less desirable eating habits and patterns, we employed the technique of small changes.   Goal(s) for next OV: 10 minutes of meditation daily     Recommended Physical Activity Goals Ana Johnson has been advised to work up to 300-450 minutes of moderate intensity aerobic activity a week and strengthening exercises 2-3 times per week for cardiovascular health, weight loss maintenance and preservation of muscle mass.   She has agreed to: Think about enjoyable ways to increase daily physical activity and overcoming barriers to exercise, Increase physical activity in their day and reduce sedentary time (increase NEAT)., and Increase volume of physical activity to a goal of 240 minutes a week   Pharmacotherapy We both agreed to: Continue with current nutritional and behavioral strategies   ASSOCIATED CONDITIONS ADDRESSED TODAY:  Insulin  resistance Assessment & Plan Lab Results  Component Value Date   HGBA1C 5.5 12/01/2023   HGBA1C 5.3 08/27/2022   HGBA1C 5.2 03/22/2022   INSULIN  13.1 12/01/2023    Managed with diet and life style interventions. Hunger and cravings are well controlled. Pt endorses that if she does have a craving she will eat an apple and 2 table spoons of peanut butter to help cravings. Discussed Metformin. Educated pt on benefits. Pt states that she will think about it but does not wish to start right now  will revisit in the future. Continue to decrease simple carbs/ sugars; increase fiber and proteins.   At next OV pt will bring in insurance requirements for Zepbound  to be covered for weight loss.     Essential hypertension Assessment & Plan BP Readings from Last 3 Encounters:  04/15/24 113/72  02/03/24 120/70  12/15/23 124/78   The 10-year ASCVD risk score (Arnett DK, et al., 2019) is:  2.1%  Lab Results  Component Value Date   CREATININE 0.75 12/01/2023   On Valsartan  80 mg. BP at goal today. Continue with medication. Work on nutrition plan to help with weight loss and increase exercise as tolerated.     Acute reaction to situational stress Assessment & Plan Pt states that her daughter has left for college and it has been stressful for her. She has not been paying attention to everything she is eating but is keeping her house clean of ice cream and sweets. Had an extensive discussion on how stress can cause emotional eating and not leading to healthy eating habits. Encouraged pt to meditate and increase exercise to help keep her mind off food.     Vitamin D  deficiency Assessment & Plan Lab Results  Component Value Date   VD25OH 22.8 (L) 12/01/2023   VD25OH 31.2 06/14/2019   VD25OH 17.4 (L) 05/18/2018   Taking ERGO 50K units weekly. Good compliance and tolerance. Will refill today. Continue taking will recheck in future.    Follow up:   Return 05/13/24 at 9:20 AM She was informed of the importance of frequent follow up visits to maximize her success with intensive lifestyle modifications for her multiple health conditions.    Subjective:    Chief complaint: Obesity Ana Johnson is here to discuss her progress with her obesity treatment plan. She is on the Category 3 Plan with B & L options and an additional 100-200 calorie of a 10:1 ratio food item and states she is following her eating plan approximately 50 % of the time. Pt is walking 30 minutes 3 days per week    Interval History:  Ana Johnson is here for a follow up office visit. Pt has experienced a weight loss of 2 lbs since last OV on 02/03/24. Pt states that she is making better choices but needs help with being consistent. Her daughter has let for college and it has caused her to be in a little of a funk. She did achieve her goal of making healthy eating out choices. She endorses scrolling on TikTok  a lot and has had to place a timer on how long she can be on.     Pharmacotherapy that aid with weight loss: She is currently taking no anti-obesity medication.    Review of Systems:  Pertinent positives were addressed with patient today.  Reviewed by clinician on day of visit: allergies, medications, problem list, medical history, surgical history, family history, social history, and previous encounter notes.  Weight Summary and Biometrics   Weight Lost Since Last Visit: 2lb  Weight Gained Since Last Visit: 0lb    Vitals Temp: 98.2 F (36.8 C) BP: 113/72 Pulse Rate: 97 SpO2: 98 %   Anthropometric Measurements Height: 5' 2 (1.575 m) Weight: 286 lb (129.7 kg) BMI (Calculated): 52.3 Weight at Last Visit: 288lb Weight Lost Since Last Visit: 2lb Weight Gained Since Last Visit: 0lb Starting Weight: 297lb Total Weight Loss (lbs): 11 lb (4.99 kg)   Body Composition  Body Fat %: 51.5 % Fat Mass (lbs): 147.4 lbs  Muscle Mass (lbs): 132 lbs Total Body Water (lbs): 93.6 lbs Visceral Fat Rating : 20   Other Clinical Data Fasting: no Labs: no Today's Visit #: 4 Starting Date: 12/01/23    Objective:   PHYSICAL EXAM: Blood pressure 113/72, pulse 97, temperature 98.2 F (36.8 C), height 5' 2 (1.575 m), weight 286 lb (129.7 kg), SpO2 98%. Body mass index is 52.31 kg/m.  General: she is overweight, cooperative and in no acute distress. PSYCH: Has normal mood, affect and thought process.   HEENT: EOMI, sclerae are anicteric. Lungs: Normal breathing effort, no conversational dyspnea. Extremities: Moves * 4 Neurologic: A and O * 3, good insight  DIAGNOSTIC DATA REVIEWED: BMET    Component Value Date/Time   NA 140 12/01/2023 1015   K 4.4 12/01/2023 1015   CL 105 12/01/2023 1015   CO2 20 12/01/2023 1015   GLUCOSE 88 12/01/2023 1015   GLUCOSE 96 08/25/2023 0933   BUN 10 12/01/2023 1015   CREATININE 0.75 12/01/2023 1015   CALCIUM 9.3 12/01/2023 1015    GFRNONAA 85 06/14/2019 0000   GFRAA 98 06/14/2019 0000   Lab Results  Component Value Date   HGBA1C 5.5 12/01/2023   HGBA1C 5.4 05/18/2018   Lab Results  Component Value Date   INSULIN  13.1 12/01/2023   Lab Results  Component Value Date   TSH 1.660 12/01/2023   CBC    Component Value Date/Time   WBC 8.6 12/01/2023 1015   WBC 9.6 06/01/2016 0745   RBC 4.48 12/01/2023 1015   RBC 4.71 06/01/2016 0745   HGB 13.1 12/01/2023 1015   HCT 39.6 12/01/2023 1015   PLT 286 12/01/2023 1015   MCV 88 12/01/2023 1015   MCH 29.2 12/01/2023 1015   MCH 28.0 06/01/2016 0745   MCHC 33.1 12/01/2023 1015   MCHC 35.6 06/01/2016 0745   RDW 14.1 12/01/2023 1015   Iron Studies    Component Value Date/Time   FERRITIN 58 06/15/2019 1023   Lipid Panel     Component Value Date/Time   CHOL 201 (H) 12/01/2023 1015   TRIG 133 12/01/2023 1015   HDL 59 12/01/2023 1015   CHOLHDL 3.4 12/01/2023 1015   CHOLHDL 4 08/25/2023 0933   VLDL 28.0 08/25/2023 0933   LDLCALC 119 (H) 12/01/2023 1015   Hepatic Function Panel     Component Value Date/Time   PROT 6.8 12/01/2023 1015   ALBUMIN 4.1 12/01/2023 1015   AST 13 12/01/2023 1015   ALT 12 12/01/2023 1015   ALKPHOS 76 12/01/2023 1015   BILITOT 0.5 12/01/2023 1015      Component Value Date/Time   TSH 1.660 12/01/2023 1015   Nutritional Lab Results  Component Value Date   VD25OH 22.8 (L) 12/01/2023   VD25OH 31.2 06/14/2019   VD25OH 17.4 (L) 05/18/2018    Attestations:   Ana Johnson, acting as a Stage manager for Ana Jenkins, Ana Johnson., have compiled all relevant documentation for today's office visit on behalf of Ana Jenkins, Ana Johnson, while in the presence of [employer], Ana Johnson.  I have spent 40 minutes in the care of the patient today including 33 minutes face-to-face assessing and reviewing listed medical problems above as outlined in office visit note and providing nutritional and behavioral counseling as outlined in obesity care  plan.   I have reviewed the above documentation for accuracy and completeness, and I agree with the above. Ana Johnson, D.O.  The 21st Century Cures Act was signed into law in 2016 which  includes the topic of electronic health records.  This provides immediate access to information in MyChart.  This includes consultation notes, operative notes, office notes, lab results and pathology reports.  If you have any questions about what you read please let us  know at your next visit so we can discuss your concerns and take corrective action if need be.  We are right here with you.

## 2024-04-28 ENCOUNTER — Ambulatory Visit: Admitting: Nurse Practitioner

## 2024-05-09 ENCOUNTER — Other Ambulatory Visit (INDEPENDENT_AMBULATORY_CARE_PROVIDER_SITE_OTHER): Payer: Self-pay | Admitting: Family Medicine

## 2024-05-09 DIAGNOSIS — E559 Vitamin D deficiency, unspecified: Secondary | ICD-10-CM

## 2024-05-12 ENCOUNTER — Encounter (INDEPENDENT_AMBULATORY_CARE_PROVIDER_SITE_OTHER): Payer: Self-pay

## 2024-05-13 ENCOUNTER — Ambulatory Visit (INDEPENDENT_AMBULATORY_CARE_PROVIDER_SITE_OTHER): Admitting: Family Medicine

## 2024-07-08 ENCOUNTER — Ambulatory Visit: Admitting: Nurse Practitioner

## 2024-08-25 ENCOUNTER — Encounter (INDEPENDENT_AMBULATORY_CARE_PROVIDER_SITE_OTHER): Payer: Self-pay | Admitting: Family Medicine

## 2024-08-25 ENCOUNTER — Ambulatory Visit (INDEPENDENT_AMBULATORY_CARE_PROVIDER_SITE_OTHER): Admitting: Family Medicine

## 2024-08-25 DIAGNOSIS — E88819 Insulin resistance, unspecified: Secondary | ICD-10-CM

## 2024-08-25 DIAGNOSIS — Z6841 Body Mass Index (BMI) 40.0 and over, adult: Secondary | ICD-10-CM

## 2024-08-25 DIAGNOSIS — I10 Essential (primary) hypertension: Secondary | ICD-10-CM

## 2024-08-25 DIAGNOSIS — E559 Vitamin D deficiency, unspecified: Secondary | ICD-10-CM

## 2024-08-25 MED ORDER — VITAMIN D (ERGOCALCIFEROL) 1.25 MG (50000 UNIT) PO CAPS: 50000.0000 [IU] | ORAL_CAPSULE | ORAL | 0 refills | Status: AC

## 2024-08-25 NOTE — Progress Notes (Signed)
 "  Ana DOROTHA Johnson, D.O.  ABFM, ABOM Specializing in Clinical Bariatric Medicine  Office located at: [address] [place], [place]  [address]      Medications Discontinued During This Encounter  Medication Reason   Vitamin D , Ergocalciferol , (DRISDOL ) 1.25 MG (50000 UNIT) CAPS capsule Reorder     Meds ordered this encounter  Medications   Vitamin D , Ergocalciferol , (DRISDOL ) 1.25 MG (50000 UNIT) CAPS capsule    Sig: Take 1 capsule (50,000 Units total) by mouth every 7 (seven) days.    Dispense:  12 capsule    Refill:  0      FOR THE CHRONIC DISEASE OF OBESITY:  Morbid obesity (HCC) - START BMI 54.31 BMI 50.0-59.9, adult (HCC) - Current BMI 53.58 Chief complaint: Obesity Ana Johnson is here to discuss her progress with her obesity treatment plan.   History of present illness / Interval history:  Ana Johnson is here today for her follow-up office visit.  Since last OV on 04/15/2024 with provider: Dr.  Jenkins, patient is up 7 lbs.  Recent challenges/ barriers to successful adherence of program recommendations: life stressors   Pt reports increased life stress, but no concerns with meal plan  --> good adherence to reduced calorie nutritional plan.  Pt has been struggling with:  []  meal planning and prepping []  exercise [x]  sleep [x]  Stressors []  mood []  chronic or acute medical conditions-  [x]  eating out more- eating out about once a week []  Nothing, doing great  Pt has been working on and improving their:  []  meal planning and prepping []  exercise []  sleep hygiene []  water intake []  strategies to better manage personal stressors []  strategies to better manage mood []  eating out less [x]  Nothing particular at this time  When asked by CMA prior to our office visit today, pt states they have been:  - Focused on eating fresh, unprocessed foods?  Yes   - Focused on eating lean proteins with each meal? No - Sleeping 7-9 Hours/ Night?  No   - Skipping Meals?   No   - States Ana Johnson is following her healthy eating plan approximately 85 % of the time.   Recent weight loss data history  04/15/24 11:00 08/25/24 10:00   Body Fat % 51.5 % 52.8 %  Muscle Mass (lbs) 132 lbs 131.4 lbs  Fat Mass (lbs) 147.4 lbs 155 lbs  Total Body Water (lbs) 93.6 lbs 93.4 lbs  Visceral Fat Rating  20 21   Counseling done on how various foods/ behaviors will affect these numbers and how to maximize weight loss success discussed today in detail based on these findings. Total lbs lost to date: -4 lbs Total Fat Mass in lbs lost to date: -0.4 lbs Total weight loss percentage to date since starting program:  -1.35 %    Physician directed Nutrition Therapy prescription: Ana Johnson is on the Category 3 Plan with B & L options and an additional 100-200 calorie of a 10:1 ratio food item.  Ana Johnson is currently in the action stage of change. As such, her goal is to continue weight management plan.   Detailed, physician-directed nutritional counseling was provided, including: [] Discussed meal prep companies. Ie. Longlife, Factor Meals, Purple carrot, Hungryroot etc [] Discussed strategies for meal planning and prepping at home [x] Discussed supermarket healthy prepared meal options- ie. Kevin's natural meals or Factor Prepared meals    Ana Johnson has agreed to: keeping a food journal and adhering to recommended goals of 1600 calories and 110+  grams of protein.     Physician directed Behavioral Modification prescription: Evidence-based interventions for healthy behavior change were utilized today including the discussion of small changes and SMART goals.  We discussed the following today: increasing lean protein intake to established goals, avoiding skipping meals, work on tracking and journaling calories using tracking application, keeping healthy foods at home, decreasing eating out or consumption of processed foods, and making healthy choices when eating convenient foods, practice  mindfulness eating and understand the difference between hunger signals and cravings, planning for success, focusing on food with a 10:1 ratio of calories: grams of protein, and discussed pre-packaged healthier meals such as Kevin's All Natural, Whole 30 chicken meals, Longlife meal prep and Factor Meals etc  Additional resources provided: Handout and personalized instruction on tracking and journaling using Apps and Handout on Daily Food Journaling log, and Handout on risks and benefits of Metformin.      Physician directed Physical Activity prescription: Pt is not exercising.  Ana Johnson has been educated on importance of exercise to increase caloric expenditure and create a calorie deficit for further weight loss and how work-out buddies and/ or fitness trackers can help motivate you to exercise and help keep you on track  Exercise prescription: Ana Johnson has agreed to Think about enjoyable ways to increase daily physical activity and overcoming barriers to exercise and Patient will begin to exercise start walking    Medical Interventions/ Pharmacotherapy Previous Bariatric surgery: none   Previously tried medical weight loss medications/ therapies: GLP-1: Semaglutide  (wegovy )  Pharmacotherapy for weight loss: Ana Johnson is not currently taking medications  for medical weight loss.     We discussed various medication options to help Ana Johnson with her weight loss efforts and we both agreed to:  Continue with current nutritional and behavioral strategies and Was educated on available treatment options, including pharmacotherapy with emphasis on potential side effects and other available medical interventions  SPECIFIC behavorial / nutritional / exercise goals for next office visit:  plans to start exercising.  Specifically: Start walking at heart rate of around 110 (105-115) and adhere to MP.   B) OBESITY RELATED CONDITIONS ADDRESSED TODAY:  Insulin  resistance Assessment & Plan Lab Results   Component Value Date   HGBA1C 5.5 12/01/2023   HGBA1C 5.3 08/27/2022   HGBA1C 5.2 03/22/2022   INSULIN  13.1 12/01/2023   Lab Results  Component Value Date   CREATININE 0.75 12/01/2023   BUN 10 12/01/2023   NA 140 12/01/2023   K 4.4 12/01/2023   CL 105 12/01/2023   CO2 20 12/01/2023  Managed by dietary and lifestyle interventions. Condition is well controlled. Reports increased hunger and cravings, especially on the weekend. Discussed starting Metformin to help manage hunger and cravings. Educated pt on risks and benefits of medication. Ana Johnson declines for now and wants to focus on adhering to prudent nutritional meal plan; will revisit in the future. Cont to decrease simple carbs, increase lean protein, and increase exercise to improve glycemic control, promote weight loss, and prevent progression to Pre-DM.  Per most recent CMP from 12/01/2023, kidney function is WNL. No acute concerns. Cont to increase water intake and increase exercise.     Essential hypertension Assessment & Plan Last 3 blood pressure readings in our office are as follows: BP Readings from Last 3 Encounters:  08/25/24 135/83  04/15/24 113/72  02/03/24 120/70   The 10-year ASCVD risk score (Arnett DK, et al., 2019) is: 4.2%  Lab Results  Component Value Date   CREATININE 0.75  12/01/2023  Currently on Diovan  once daily with good compliance and tolerance. BP is above goal today at 135/83; optimal  <120/80. Pt asx. No acute concerns. Cont low-sodium, heart-healthy diet. Increase water intake. Cont meds per PCP. F/u with PCP as needed.      Vitamin D  deficiency Assessment & Plan Lab Results  Component Value Date   VD25OH 22.8 (L) 12/01/2023   VD25OH 31.2 06/14/2019   VD25OH 17.4 (L) 05/18/2018  Currently on Ergo once weekly with good compliance and tolerance. Most recent Vit D levels from 12/01/2023 were 22.8; optimal bt 50-70. Cont regimen(refill today). Recheck levels at next OV.    Follow up:    Return 2-4 weeks.   Ana Johnson was informed of the importance of frequent follow up visits to maximize her success with intensive lifestyle modifications for her multiple health conditions.   Weight Summary and Biometrics   Weight Lost Since Last Visit: 0lb  Weight Gained Since Last Visit: 7lb    Vitals Temp: 98.2 F (36.8 C) BP: 135/83 Pulse Rate: (!) 114 SpO2: 96 %   Anthropometric Measurements Height: 5' 2 (1.575 m) Weight: 293 lb (132.9 kg) BMI (Calculated): 53.58 Weight at Last Visit: 286lb Weight Lost Since Last Visit: 0lb Weight Gained Since Last Visit: 7lb Starting Weight: 297lb Total Weight Loss (lbs): 4 lb (1.814 kg)   Body Composition  Body Fat %: 52.8 % Fat Mass (lbs): 155 lbs Muscle Mass (lbs): 131.4 lbs Total Body Water (lbs): 93.4 lbs Visceral Fat Rating : 21   Other Clinical Data Fasting: no Labs: no Today's Visit #: 5 Starting Date: 12/01/23    Objective:   PHYSICAL EXAM: Blood pressure 135/83, pulse (!) 114, temperature 98.2 F (36.8 C), height 5' 2 (1.575 m), weight 293 lb (132.9 kg), SpO2 96%. Body mass index is 53.59 kg/m. General: Ana Johnson is overweight, cooperative and in no acute distress. PSYCH: Has normal mood, affect and thought process.   HEENT: EOMI, sclerae are anicteric. Lungs: Normal breathing effort, no conversational dyspnea. Extremities: Moves * 4 Neurologic: A and O * 3, good insight  DIAGNOSTIC DATA REVIEWED: BMET    Component Value Date/Time   NA 140 12/01/2023 1015   K 4.4 12/01/2023 1015   CL 105 12/01/2023 1015   CO2 20 12/01/2023 1015   GLUCOSE 88 12/01/2023 1015   GLUCOSE 96 08/25/2023 0933   BUN 10 12/01/2023 1015   CREATININE 0.75 12/01/2023 1015   CALCIUM 9.3 12/01/2023 1015   GFRNONAA 85 06/14/2019 0000   GFRAA 98 06/14/2019 0000   Lab Results  Component Value Date   HGBA1C 5.5 12/01/2023   HGBA1C 5.4 05/18/2018   Lab Results  Component Value Date   INSULIN  13.1 12/01/2023   Lab Results   Component Value Date   TSH 1.660 12/01/2023   CBC    Component Value Date/Time   WBC 8.6 12/01/2023 1015   WBC 9.6 06/01/2016 0745   RBC 4.48 12/01/2023 1015   RBC 4.71 06/01/2016 0745   HGB 13.1 12/01/2023 1015   HCT 39.6 12/01/2023 1015   PLT 286 12/01/2023 1015   MCV 88 12/01/2023 1015   MCH 29.2 12/01/2023 1015   MCH 28.0 06/01/2016 0745   MCHC 33.1 12/01/2023 1015   MCHC 35.6 06/01/2016 0745   RDW 14.1 12/01/2023 1015   Iron Studies    Component Value Date/Time   FERRITIN 58 06/15/2019 1023   Lipid Panel     Component Value Date/Time   CHOL 201 (H) 12/01/2023 1015  TRIG 133 12/01/2023 1015   HDL 59 12/01/2023 1015   CHOLHDL 3.4 12/01/2023 1015   CHOLHDL 4 08/25/2023 0933   VLDL 28.0 08/25/2023 0933   LDLCALC 119 (H) 12/01/2023 1015   Hepatic Function Panel     Component Value Date/Time   PROT 6.8 12/01/2023 1015   ALBUMIN 4.1 12/01/2023 1015   AST 13 12/01/2023 1015   ALT 12 12/01/2023 1015   ALKPHOS 76 12/01/2023 1015   BILITOT 0.5 12/01/2023 1015      Component Value Date/Time   TSH 1.660 12/01/2023 1015   Nutritional Lab Results  Component Value Date   VD25OH 22.8 (L) 12/01/2023   VD25OH 31.2 06/14/2019   VD25OH 17.4 (L) 05/18/2018    Attestations:   LILLETTE Feliciano Mingle, acting as a stage manager for Ana Jenkins, DO., have compiled all relevant documentation for today's office visit on behalf of Ana Jenkins, DO, while in the presence of [employer], DO.    I have reviewed the above documentation for accuracy and completeness, and I agree with the above. Ana Johnson, D.O.  The 21st Century Cures Act was signed into law in 2016 which includes the topic of electronic health records.  This provides immediate access to information in MyChart.  This includes consultation notes, operative notes, office notes, lab results and pathology reports.  If you have any questions about what you read please let us  know at your next visit so we  can discuss your concerns and take corrective action if need be.  We are right here with you.  "

## 2024-09-14 ENCOUNTER — Ambulatory Visit (INDEPENDENT_AMBULATORY_CARE_PROVIDER_SITE_OTHER): Admitting: Family Medicine

## 2024-09-15 ENCOUNTER — Ambulatory Visit: Admitting: Nurse Practitioner

## 2024-11-05 ENCOUNTER — Ambulatory Visit: Admitting: Nurse Practitioner
